# Patient Record
Sex: Male | Born: 1950 | Race: White | Hispanic: No | Marital: Married | Smoking: Never smoker
Health system: Southern US, Community
[De-identification: ages and names within clinical notes are randomized; demographics above are authoritative.]

## PROBLEM LIST (undated history)

## (undated) DIAGNOSIS — I700 Atherosclerosis of aorta: Secondary | ICD-10-CM

## (undated) DIAGNOSIS — R0609 Other forms of dyspnea: Secondary | ICD-10-CM

## (undated) DIAGNOSIS — Z7902 Long term (current) use of antithrombotics/antiplatelets: Secondary | ICD-10-CM

## (undated) DIAGNOSIS — H9190 Unspecified hearing loss, unspecified ear: Secondary | ICD-10-CM

## (undated) DIAGNOSIS — N400 Enlarged prostate without lower urinary tract symptoms: Secondary | ICD-10-CM

## (undated) DIAGNOSIS — I779 Disorder of arteries and arterioles, unspecified: Secondary | ICD-10-CM

## (undated) DIAGNOSIS — G4730 Sleep apnea, unspecified: Secondary | ICD-10-CM

## (undated) DIAGNOSIS — E785 Hyperlipidemia, unspecified: Secondary | ICD-10-CM

## (undated) DIAGNOSIS — R112 Nausea with vomiting, unspecified: Secondary | ICD-10-CM

## (undated) DIAGNOSIS — I10 Essential (primary) hypertension: Secondary | ICD-10-CM

## (undated) DIAGNOSIS — Z9841 Cataract extraction status, right eye: Secondary | ICD-10-CM

## (undated) DIAGNOSIS — G709 Myoneural disorder, unspecified: Secondary | ICD-10-CM

## (undated) DIAGNOSIS — R7303 Prediabetes: Secondary | ICD-10-CM

## (undated) DIAGNOSIS — F32A Depression, unspecified: Secondary | ICD-10-CM

## (undated) DIAGNOSIS — F329 Major depressive disorder, single episode, unspecified: Secondary | ICD-10-CM

## (undated) DIAGNOSIS — M179 Osteoarthritis of knee, unspecified: Secondary | ICD-10-CM

## (undated) DIAGNOSIS — M1990 Unspecified osteoarthritis, unspecified site: Secondary | ICD-10-CM

## (undated) DIAGNOSIS — I209 Angina pectoris, unspecified: Secondary | ICD-10-CM

## (undated) DIAGNOSIS — E119 Type 2 diabetes mellitus without complications: Secondary | ICD-10-CM

## (undated) DIAGNOSIS — Z9889 Other specified postprocedural states: Secondary | ICD-10-CM

## (undated) DIAGNOSIS — M25561 Pain in right knee: Secondary | ICD-10-CM

## (undated) DIAGNOSIS — T4145XA Adverse effect of unspecified anesthetic, initial encounter: Secondary | ICD-10-CM

## (undated) DIAGNOSIS — T8859XA Other complications of anesthesia, initial encounter: Secondary | ICD-10-CM

## (undated) DIAGNOSIS — Z9842 Cataract extraction status, left eye: Secondary | ICD-10-CM

## (undated) DIAGNOSIS — R011 Cardiac murmur, unspecified: Secondary | ICD-10-CM

## (undated) DIAGNOSIS — G4700 Insomnia, unspecified: Secondary | ICD-10-CM

## (undated) DIAGNOSIS — H9191 Unspecified hearing loss, right ear: Secondary | ICD-10-CM

## (undated) DIAGNOSIS — K5790 Diverticulosis of intestine, part unspecified, without perforation or abscess without bleeding: Secondary | ICD-10-CM

## (undated) HISTORY — PX: OTHER SURGICAL HISTORY: SHX169

## (undated) HISTORY — PX: AMPUTATION FINGER / THUMB: SUR24

## (undated) HISTORY — PX: PHOTOREFRACTIVE KERATOTOMY: SHX216

## (undated) HISTORY — PX: CORONARY ANGIOPLASTY: SHX604

---

## 1998-09-24 HISTORY — PX: HERNIA REPAIR: SHX51

## 2001-09-24 HISTORY — PX: KNEE ARTHROSCOPY: SUR90

## 2011-09-05 ENCOUNTER — Ambulatory Visit: Payer: Self-pay | Admitting: Gastroenterology

## 2011-09-05 DIAGNOSIS — K5790 Diverticulosis of intestine, part unspecified, without perforation or abscess without bleeding: Secondary | ICD-10-CM

## 2011-09-05 HISTORY — DX: Diverticulosis of intestine, part unspecified, without perforation or abscess without bleeding: K57.90

## 2013-02-16 ENCOUNTER — Emergency Department: Payer: Self-pay | Admitting: Emergency Medicine

## 2013-02-16 DIAGNOSIS — T300 Burn of unspecified body region, unspecified degree: Secondary | ICD-10-CM | POA: Insufficient documentation

## 2013-02-16 LAB — CBC WITH DIFFERENTIAL/PLATELET
Basophil #: 0.1 10*3/uL (ref 0.0–0.1)
Basophil %: 1.2 %
Eosinophil #: 0.1 10*3/uL (ref 0.0–0.7)
Eosinophil %: 1.2 %
HCT: 46.9 % (ref 40.0–52.0)
HGB: 16.2 g/dL (ref 13.0–18.0)
Monocyte #: 0.4 x10 3/mm (ref 0.2–1.0)
Monocyte %: 6.5 %
Neutrophil #: 5.4 10*3/uL (ref 1.4–6.5)
Neutrophil %: 79.4 %
WBC: 6.8 10*3/uL (ref 3.8–10.6)

## 2013-02-16 LAB — COMPREHENSIVE METABOLIC PANEL
Alkaline Phosphatase: 69 U/L (ref 50–136)
Anion Gap: 7 (ref 7–16)
BUN: 24 mg/dL — ABNORMAL HIGH (ref 7–18)
Calcium, Total: 9.6 mg/dL (ref 8.5–10.1)
Chloride: 108 mmol/L — ABNORMAL HIGH (ref 98–107)
Co2: 29 mmol/L (ref 21–32)
Creatinine: 1.29 mg/dL (ref 0.60–1.30)
EGFR (African American): >60
EGFR (Non-African Amer.): 59 — ABNORMAL LOW
Glucose: 103 mg/dL — ABNORMAL HIGH (ref 65–99)
SGPT (ALT): 30 U/L (ref 12–78)
Sodium: 144 mmol/L (ref 136–145)

## 2013-03-04 DIAGNOSIS — T22299A Burn of second degree of multiple sites of unspecified shoulder and upper limb, except wrist and hand, initial encounter: Secondary | ICD-10-CM | POA: Insufficient documentation

## 2013-03-04 DIAGNOSIS — T23299A Burn of second degree of multiple sites of unspecified wrist and hand, initial encounter: Secondary | ICD-10-CM | POA: Insufficient documentation

## 2014-03-29 DIAGNOSIS — M7630 Iliotibial band syndrome, unspecified leg: Secondary | ICD-10-CM | POA: Insufficient documentation

## 2014-06-07 ENCOUNTER — Ambulatory Visit: Payer: Self-pay | Admitting: Orthopedic Surgery

## 2015-02-03 DIAGNOSIS — M1711 Unilateral primary osteoarthritis, right knee: Secondary | ICD-10-CM | POA: Insufficient documentation

## 2015-02-03 DIAGNOSIS — S83231A Complex tear of medial meniscus, current injury, right knee, initial encounter: Secondary | ICD-10-CM | POA: Insufficient documentation

## 2015-02-23 ENCOUNTER — Ambulatory Visit: Payer: 59 | Admitting: Anesthesiology

## 2015-02-23 ENCOUNTER — Ambulatory Visit
Admission: RE | Admit: 2015-02-23 | Discharge: 2015-02-23 | Disposition: A | Discharge status: Home / Self Care | Payer: 59 | Source: Ambulatory Visit | Attending: Surgery | Admitting: Surgery

## 2015-02-23 ENCOUNTER — Encounter
Admission: RE | Disposition: A | Discharge status: Home / Self Care | Payer: Self-pay | Source: Ambulatory Visit | Attending: Surgery

## 2015-02-23 DIAGNOSIS — I10 Essential (primary) hypertension: Secondary | ICD-10-CM | POA: Diagnosis not present

## 2015-02-23 DIAGNOSIS — M1711 Unilateral primary osteoarthritis, right knee: Secondary | ICD-10-CM | POA: Insufficient documentation

## 2015-02-23 DIAGNOSIS — G4700 Insomnia, unspecified: Secondary | ICD-10-CM | POA: Diagnosis not present

## 2015-02-23 DIAGNOSIS — K5790 Diverticulosis of intestine, part unspecified, without perforation or abscess without bleeding: Secondary | ICD-10-CM | POA: Insufficient documentation

## 2015-02-23 DIAGNOSIS — F329 Major depressive disorder, single episode, unspecified: Secondary | ICD-10-CM | POA: Insufficient documentation

## 2015-02-23 DIAGNOSIS — X58XXXA Exposure to other specified factors, initial encounter: Secondary | ICD-10-CM | POA: Insufficient documentation

## 2015-02-23 DIAGNOSIS — Z79899 Other long term (current) drug therapy: Secondary | ICD-10-CM | POA: Diagnosis not present

## 2015-02-23 DIAGNOSIS — S83231A Complex tear of medial meniscus, current injury, right knee, initial encounter: Secondary | ICD-10-CM | POA: Insufficient documentation

## 2015-02-23 DIAGNOSIS — G4730 Sleep apnea, unspecified: Secondary | ICD-10-CM | POA: Insufficient documentation

## 2015-02-23 DIAGNOSIS — S83241A Other tear of medial meniscus, current injury, right knee, initial encounter: Secondary | ICD-10-CM | POA: Diagnosis present

## 2015-02-23 HISTORY — DX: Insomnia, unspecified: G47.00

## 2015-02-23 HISTORY — DX: Unspecified hearing loss, unspecified ear: H91.90

## 2015-02-23 HISTORY — DX: Other specified postprocedural states: Z98.890

## 2015-02-23 HISTORY — DX: Pain in right knee: M25.561

## 2015-02-23 HISTORY — DX: Other complications of anesthesia, initial encounter: T88.59XA

## 2015-02-23 HISTORY — DX: Adverse effect of unspecified anesthetic, initial encounter: T41.45XA

## 2015-02-23 HISTORY — DX: Diverticulosis of intestine, part unspecified, without perforation or abscess without bleeding: K57.90

## 2015-02-23 HISTORY — PX: KNEE ARTHROSCOPY: SHX127

## 2015-02-23 HISTORY — DX: Unspecified osteoarthritis, unspecified site: M19.90

## 2015-02-23 HISTORY — DX: Sleep apnea, unspecified: G47.30

## 2015-02-23 HISTORY — DX: Depression, unspecified: F32.A

## 2015-02-23 HISTORY — DX: Nausea with vomiting, unspecified: R11.2

## 2015-02-23 HISTORY — DX: Major depressive disorder, single episode, unspecified: F32.9

## 2015-02-23 SURGERY — ARTHROSCOPY, KNEE
Anesthesia: General | Laterality: Right | Wound class: Clean

## 2015-02-23 MED ORDER — BUPIVACAINE-EPINEPHRINE (PF) 0.5% -1:200000 IJ SOLN
INTRAMUSCULAR | Status: DC | PRN
  Administered 2015-02-23: 30 mL

## 2015-02-23 MED ORDER — METOCLOPRAMIDE HCL 5 MG PO TABS: 5.0000 mg | ORAL_TABLET | Freq: Three times a day (TID) | ORAL | Status: DC | PRN

## 2015-02-23 MED ORDER — ONDANSETRON HCL 4 MG/2ML IJ SOLN
INTRAMUSCULAR | Status: DC | PRN
  Administered 2015-02-23: 4 mg via INTRAVENOUS

## 2015-02-23 MED ORDER — ONDANSETRON HCL 4 MG/2ML IJ SOLN: 4.0000 mg | Freq: Four times a day (QID) | INTRAMUSCULAR | Status: DC | PRN

## 2015-02-23 MED ORDER — LIDOCAINE HCL (PF) 1 % IJ SOLN
INTRAMUSCULAR | Status: DC | PRN
  Administered 2015-02-23: 30 mL

## 2015-02-23 MED ORDER — BUPIVACAINE-EPINEPHRINE (PF) 0.5% -1:200000 IJ SOLN
INTRAMUSCULAR | Status: DC | PRN
  Administered 2015-02-23: 10 mL

## 2015-02-23 MED ORDER — POTASSIUM CHLORIDE IN NACL 20-0.9 MEQ/L-% IV SOLN: INTRAVENOUS | Status: DC

## 2015-02-23 MED ORDER — METOCLOPRAMIDE HCL 5 MG/ML IJ SOLN: 5.0000 mg | Freq: Three times a day (TID) | INTRAMUSCULAR | Status: DC | PRN

## 2015-02-23 MED ORDER — ONDANSETRON HCL 4 MG PO TABS: 4.0000 mg | ORAL_TABLET | Freq: Four times a day (QID) | ORAL | Status: DC | PRN

## 2015-02-23 MED ORDER — OXYCODONE HCL 5 MG PO TABS: 5.0000 mg | ORAL_TABLET | ORAL | Status: DC | PRN

## 2015-02-23 MED ORDER — ONDANSETRON HCL 4 MG/2ML IJ SOLN: 4.0000 mg | Freq: Once | INTRAMUSCULAR | Status: DC | PRN

## 2015-02-23 MED ORDER — MIDAZOLAM HCL 5 MG/5ML IJ SOLN
INTRAMUSCULAR | Status: DC | PRN
  Administered 2015-02-23: 2 mg via INTRAVENOUS

## 2015-02-23 MED ORDER — FENTANYL CITRATE (PF) 100 MCG/2ML IJ SOLN
INTRAMUSCULAR | Status: DC | PRN
  Administered 2015-02-23 (×2): 50 ug via INTRAVENOUS

## 2015-02-23 MED ORDER — LACTATED RINGERS IV SOLN
INTRAVENOUS | Status: DC
  Administered 2015-02-23: 12:00:00 via INTRAVENOUS

## 2015-02-23 MED ORDER — HYDROCODONE-ACETAMINOPHEN 7.5-325 MG PO TABS: 1.0000 | ORAL_TABLET | Freq: Once | ORAL | Status: DC | PRN

## 2015-02-23 MED ORDER — LIDOCAINE HCL (CARDIAC) 20 MG/ML IV SOLN
INTRAVENOUS | Status: DC | PRN
  Administered 2015-02-23: 40 mg via INTRATRACHEAL

## 2015-02-23 MED ORDER — LACTATED RINGERS IR SOLN
Status: DC | PRN
  Administered 2015-02-23: 600 mL

## 2015-02-23 MED ORDER — DEXAMETHASONE SODIUM PHOSPHATE 4 MG/ML IJ SOLN
INTRAMUSCULAR | Status: DC | PRN
  Administered 2015-02-23: 8 mg via INTRAVENOUS

## 2015-02-23 MED ORDER — SCOPOLAMINE 1 MG/3DAYS TD PT72
1.0000 | MEDICATED_PATCH | Freq: Once | TRANSDERMAL | Status: DC
  Administered 2015-02-23: 1.5 mg via TRANSDERMAL

## 2015-02-23 MED ORDER — PROPOFOL 10 MG/ML IV BOLUS
INTRAVENOUS | Status: DC | PRN
Start: 2015-02-23 — End: 2015-02-23
  Administered 2015-02-23: 200 mg via INTRAVENOUS

## 2015-02-23 MED ORDER — GLYCOPYRROLATE 0.2 MG/ML IJ SOLN
INTRAMUSCULAR | Status: DC | PRN
  Administered 2015-02-23: 0.2 mg via INTRAVENOUS

## 2015-02-23 MED ORDER — CEFAZOLIN SODIUM-DEXTROSE 2-3 GM-% IV SOLR
2.0000 g | Freq: Once | INTRAVENOUS | Status: AC
  Administered 2015-02-23: 2 g via INTRAVENOUS

## 2015-02-23 MED ORDER — FENTANYL CITRATE (PF) 100 MCG/2ML IJ SOLN: 25.0000 ug | INTRAMUSCULAR | Status: DC | PRN

## 2015-02-23 SURGICAL SUPPLY — 45 items
ADAPTER IRRIG TUBE 2 SPIKE SOL (ADAPTER) IMPLANT
BANDAGE ELASTIC 2 VELCRO NS LF (GAUZE/BANDAGES/DRESSINGS) IMPLANT
BANDAGE ELASTIC 4 VELCRO NS (GAUZE/BANDAGES/DRESSINGS) IMPLANT
BANDAGE ELASTIC 6 VELCRO NS (GAUZE/BANDAGES/DRESSINGS) ×3 IMPLANT
BLADE FULL RADIUS 3.5 (BLADE) ×3 IMPLANT
BLADE SHAVER 4.5X7 STR FR (MISCELLANEOUS) IMPLANT
BUR ACROMIONIZER 4.0 (BURR) IMPLANT
BUR BR 5.5 WIDE MOUTH (BURR) IMPLANT
CANNULA SHAVER 8MMX76MM (CANNULA) IMPLANT
CHLORAPREP W/TINT 26ML (MISCELLANEOUS) ×3 IMPLANT
COVER LIGHT HANDLE UNIVERSAL (MISCELLANEOUS) ×3 IMPLANT
CUFF TOURN SGL QUICK 24 (TOURNIQUET CUFF)
CUFF TOURN SGL QUICK 30 (MISCELLANEOUS) ×2
CUFF TOURN SGL QUICK 34 (TOURNIQUET CUFF)
CUFF TRNQT CYL 24X4X40X1 (TOURNIQUET CUFF) IMPLANT
CUFF TRNQT CYL 34X4X40X1 (TOURNIQUET CUFF) IMPLANT
CUFF TRNQT CYL LO 30X4X (MISCELLANEOUS) ×1 IMPLANT
GAUZE PETRO XEROFOAM 1X8 (MISCELLANEOUS) IMPLANT
GAUZE SPONGE 4X4 12PLY STRL (GAUZE/BANDAGES/DRESSINGS) ×3 IMPLANT
GLOVE BIO SURGEON STRL SZ8 (GLOVE) ×6 IMPLANT
GLOVE INDICATOR 8.0 STRL GRN (GLOVE) ×3 IMPLANT
GOWN STRL REUS W/ TWL LRG LVL3 (GOWN DISPOSABLE) ×1 IMPLANT
GOWN STRL REUS W/ TWL XL LVL3 (GOWN DISPOSABLE) ×1 IMPLANT
GOWN STRL REUS W/TWL LRG LVL3 (GOWN DISPOSABLE) ×2
GOWN STRL REUS W/TWL XL LVL3 (GOWN DISPOSABLE) ×2
IV LACTATED RINGER IRRG 3000ML (IV SOLUTION) ×4
IV LR IRRIG 3000ML ARTHROMATIC (IV SOLUTION) ×2 IMPLANT
KIT CANNULA 8X76-LX IN CANNULA (CANNULA) IMPLANT
MANIFOLD 4PT FOR NEPTUNE1 (MISCELLANEOUS) ×3 IMPLANT
MAT BLUE FLOOR 46X72 FLO (MISCELLANEOUS) ×3 IMPLANT
NDL MAYO CATGUT SZ5 (NEEDLE)
NDL SUT 5 .5 CRC TPR PNT MAYO (NEEDLE) IMPLANT
NEEDLE HYPO 21X1.5 SAFETY (NEEDLE) ×6 IMPLANT
PACK ARTHROSCOPY KNEE (MISCELLANEOUS) ×3 IMPLANT
PAD GROUND ADULT SPLIT (MISCELLANEOUS) IMPLANT
STAPLER SKIN PROX 35W (STAPLE) IMPLANT
STRAP BODY AND KNEE 60X3 (MISCELLANEOUS) ×3 IMPLANT
SUT ETHIBOND 0 MO6 C/R (SUTURE) IMPLANT
SUT PROLENE 4 0 PS 2 18 (SUTURE) ×3 IMPLANT
SUT VIC AB 2-0 CT1 27 (SUTURE)
SUT VIC AB 2-0 CT1 TAPERPNT 27 (SUTURE) IMPLANT
SYR 50ML LL SCALE MARK (SYRINGE) ×3 IMPLANT
TUBING ARTHRO INFLOW-ONLY STRL (TUBING) ×3 IMPLANT
WAND HAND CNTRL MULTIVAC 90 (MISCELLANEOUS) ×3 IMPLANT
WEIGHT CUFF 3# RED (MISCELLANEOUS) IMPLANT

## 2015-02-23 NOTE — Op Note (Signed)
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  Postoperative diagnosis:   Same  Procedure:   Arthroscopic partial medial meniscectomy and debridement right knee.  Surgeon:   Maryagnes AmosJ. Jeffrey Poggi, M.D.  Anesthesia:   General LMA.  Findings:   As above. The lateral meniscus was in satisfactory condition, as were the anterior and posterior cruciate ligaments. There were grade 1-2 chondromalacial changes involving the medial and lateral femoral condyles, the medial tibial plateau, and the patella.  Complications:   None.  EBL:   <5 cc.  Total fluids:   700 cc of crystalloid.  Tourniquet time:   None  Drains:   None  Closure:   4-0 Prolene interrupted sutures.  Brief clinical note:   The patient is a 64 year old male with a several month history of medial sided right knee pain. His symptoms have persisted despite medications, activity modification, etc. His history and examination are consistent with a degenerative medial meniscus tear confirmed by MRI scan. The patient presents at this time for arthroscopy, debridement, and partial lateral meniscectomy.  Procedure:   The patient was brought into the operating room and lain in the supine position. After adequate general laryngal mask anesthesia was obtained, a timeout was performed to verify the appropriate side. The patient's right knee was injected sterilely using a solution of 30 cc of 1% lidocaine and 30 cc of 0.5% Sensorcaine with epinephrine. The right lower extremity was prepped with DuraPrep solution before being draped sterilely. Preoperative antibiotics were administered. The expected portal sites were injected with 0.5% Sensorcaine without for the camera was placed in the anterolateral portal and instrumentation performed through the anteromedial portal. The knee was sequentially examined beginning in the suprapatellar pouch the progressing  to the patellofemoral space, the medial gutter compartment, the notch, and finally the lateral compartment entered. The findings were as described above. Abundant reactive synovial tissues anteriorly were debrided using the full-radius resector in order to improve visualization. The complex degenerative tear involving the posterior horn of the medial meniscus was debrided back to stable margins using the mini much was in full radius resector. Some probing of the remaining rim demonstrated excellent stability. Laterally, the meniscus was intact to probing. The ArthroCare wand was used to obtain hemostasis, controlling some bleeding and developed after debriding the abundant reactive synovial tissues anteriorly. The instruments were removed from the joint after suctioning the excess fluid. The portal sites were closed using 4-0 Prolene interrupted sutures before a sterile bulky dressing was applied to the knee. The patient was then awakened, extubated, and returned to the recovery room in satisfactory condition after tolerating the procedure well.

## 2015-02-23 NOTE — Anesthesia Preprocedure Evaluation (Signed)
Anesthesia Evaluation  Patient identified by MRN, date of birth, ID band  Reviewed: Allergy & Precautions, H&P , NPO status , Patient's Chart, lab work & pertinent test results  History of Anesthesia Complications (+) history of anesthetic complications  Airway Mallampati: II  TM Distance: >3 FB Neck ROM: full    Dental no notable dental hx.    Pulmonary    Pulmonary exam normal       Cardiovascular Rhythm:regular Rate:Normal     Neuro/Psych PSYCHIATRIC DISORDERS    GI/Hepatic   Endo/Other    Renal/GU      Musculoskeletal   Abdominal   Peds  Hematology   Anesthesia Other Findings   Reproductive/Obstetrics                             Anesthesia Physical Anesthesia Plan  ASA: II  Anesthesia Plan: General LMA   Post-op Pain Management:    Induction:   Airway Management Planned:   Additional Equipment:   Intra-op Plan:   Post-operative Plan:   Informed Consent: I have reviewed the patients History and Physical, chart, labs and discussed the procedure including the risks, benefits and alternatives for the proposed anesthesia with the patient or authorized representative who has indicated his/her understanding and acceptance.     Plan Discussed with: CRNA  Anesthesia Plan Comments:         Anesthesia Quick Evaluation

## 2015-02-23 NOTE — Anesthesia Procedure Notes (Signed)
Procedure Name: LMA Insertion Date/Time: 02/23/2015 12:08 PM Performed by: Andee PolesBUSH, Randy Delsol Pre-anesthesia Checklist: Patient identified, Emergency Drugs available, Suction available, Timeout performed and Patient being monitored Patient Re-evaluated:Patient Re-evaluated prior to inductionOxygen Delivery Method: Circle system utilized Preoxygenation: Pre-oxygenation with 100% oxygen Intubation Type: IV induction LMA: LMA inserted LMA Size: 4.0 Number of attempts: 1 Placement Confirmation: positive ETCO2 and breath sounds checked- equal and bilateral Tube secured with: Tape

## 2015-02-23 NOTE — H&P (Signed)
Paper H&P to be scanned into permanent record. H&P reviewed. No changes. 

## 2015-02-23 NOTE — Anesthesia Postprocedure Evaluation (Signed)
  Anesthesia Post-op Note  Patient: Randy Shaffer  Procedure(s) Performed: Procedure(s): ARTHROSCOPY KNEE WITH DEBRIDEMENT PARTIAL MEDIAL MENISECTOMY (Right)  Anesthesia type:General LMA  Patient location: PACU  Post pain: Pain level controlled  Post assessment: Post-op Vital signs reviewed, Patient's Cardiovascular Status Stable, Respiratory Function Stable, Patent Airway and No signs of Nausea or vomiting  Post vital signs: Reviewed and stable  Last Vitals:  Filed Vitals:   02/23/15 1338  BP:   Pulse:   Temp: 36.4 C  Resp: 12    Level of consciousness: awake, alert  and patient cooperative  Complications: No apparent anesthesia complications

## 2015-02-23 NOTE — Discharge Instructions (Signed)
General Anesthesia, Care After °Refer to this sheet in the next few weeks. These instructions provide you with information on caring for yourself after your procedure. Your health care provider may also give you more specific instructions. Your treatment has been planned according to current medical practices, but problems sometimes occur. Call your health care provider if you have any problems or questions after your procedure. °WHAT TO EXPECT AFTER THE PROCEDURE °After the procedure, it is typical to experience: °· Sleepiness. °· Nausea and vomiting. °HOME CARE INSTRUCTIONS °· For the first 24 hours after general anesthesia: °¨ Have a responsible person with you. °¨ Do not drive a car. If you are alone, do not take public transportation. °¨ Do not drink alcohol. °¨ Do not take medicine that has not been prescribed by your health care provider. °¨ Do not sign important papers or make important decisions. °¨ You may resume a normal diet and activities as directed by your health care provider. °· Change bandages (dressings) as directed. °· If you have questions or problems that seem related to general anesthesia, call the hospital and ask for the anesthetist or anesthesiologist on call. °SEEK MEDICAL CARE IF: °· You have nausea and vomiting that continue the day after anesthesia. °· You develop a rash. °SEEK IMMEDIATE MEDICAL CARE IF:  °· You have difficulty breathing. °· You have chest pain. °· You have any allergic problems. °Document Released: 12/17/2000 Document Revised: 09/15/2013 Document Reviewed: 03/26/2013 °[employer]® Patient Information ©2015 [employer]. This information is not intended to replace advice given to you by your health care provider. Make sure you discuss any questions you have with your health care provider. ° °Keep dressing dry and intact.  °May shower after dressing changed on post-op day #4 (Sunday).  °Cover staples/sutures with Band-Aids after drying off. °Apply ice frequently to  knee. °May weight-bear as tolerated - use crutches or walker as needed. °Follow-up in 10-14 days or as scheduled. °

## 2015-02-23 NOTE — Transfer of Care (Signed)
Immediate Anesthesia Transfer of Care Note  Patient: Randy Shaffer  Procedure(s) Performed: Procedure(s): ARTHROSCOPY KNEE WITH DEBRIDEMENT PARTIAL MEDIAL MENISECTOMY (Right)  Patient Location: PACU  Anesthesia Type: General LMA  Level of Consciousness: awake, alert  and patient cooperative  Airway and Oxygen Therapy: Patient Spontanous Breathing and Patient connected to supplemental oxygen  Post-op Assessment: Post-op Vital signs reviewed, Patient's Cardiovascular Status Stable, Respiratory Function Stable, Patent Airway and No signs of Nausea or vomiting  Post-op Vital Signs: Reviewed and stable  Complications: No apparent anesthesia complications

## 2015-02-24 ENCOUNTER — Encounter: Payer: Self-pay | Admitting: Surgery

## 2015-09-01 ENCOUNTER — Ambulatory Visit: Payer: Self-pay

## 2015-09-01 ENCOUNTER — Other Ambulatory Visit: Payer: Self-pay | Admitting: Occupational Medicine

## 2015-09-01 DIAGNOSIS — Z0000 Encounter for general adult medical examination without abnormal findings: Secondary | ICD-10-CM

## 2016-10-05 ENCOUNTER — Ambulatory Visit (INDEPENDENT_AMBULATORY_CARE_PROVIDER_SITE_OTHER): Payer: 59 | Admitting: Orthopaedic Surgery

## 2016-10-05 ENCOUNTER — Ambulatory Visit (INDEPENDENT_AMBULATORY_CARE_PROVIDER_SITE_OTHER): Payer: 59

## 2016-10-05 VITALS — BP 128/80 | HR 64

## 2016-10-05 DIAGNOSIS — M25561 Pain in right knee: Secondary | ICD-10-CM | POA: Diagnosis not present

## 2016-10-05 DIAGNOSIS — G8929 Other chronic pain: Secondary | ICD-10-CM | POA: Diagnosis not present

## 2016-10-05 NOTE — Progress Notes (Signed)
Office Visit Note   Patient: Randy Shaffer           Date of Birth: 04/12/1951           MRN: [id number] Visit Date: 10/05/2016              Requested by: Jerl MinaJames Hedrick, MD [address] [facility] [place], [place] [id number] PCP: Jerl MinaJames Hedrick, MD   Assessment & Plan: Visit Diagnoses:  1. Chronic pain of right knee           Mild medial joint narrowing post medial partial meniscectomy times 2.   Plan: Standing x-rays show minimal right knee  medial compartment narrowing. I discussed and not do not think he is going to be needing an knee replacement anytime soon. He can continue taking the Aleve intermittently take some Tylenol ice his knee at the end of the day continued to walk stay active and stay the in good shape as he has been doing. He has increased symptoms he could consider another cortisone injection which has not been done since before his second surgical procedure. We reviewed the x-rays and reviewed the arthroscopic photos he brought in discussed him in detail. We discussed activities are less likely to bother his knee such as walking on flat ground using a bike.  Follow-Up Instructions: No Follow-up on file.   Orders:  Orders Placed This Encounter  Procedures  . XR Knee 1-2 Views Right   No orders of the defined types were placed in this encounter.     Procedures: No procedures performed   Clinical Data: No additional findings.   Subjective: Chief Complaint  Patient presents with  . Right Knee - Pain    Randy Shaffer is here today complaining of right knee pain. Patient has had 2 scoped on his knee in the past 13 years. He states the first scope worked for many years. He states the scope he just recently had in 2016 only worked for about 2 months. He states when he sits for a bit he has a "ripping pain" in his knee. Painful after getting up from sitting. He states he is very active and goes up and down stairs most of the day at work.   Patient's active  walks 13 miles per day he brought with him arthroscopic pictures from both his procedures for review. This shows lateral compartment normal and some partial posterior medial meniscectomy with mild chondromalacia in the medial compartment which appears to be grade 2. He is very active in his job and walks 13 miles a day. Sometimes at the end of the dates aching more. Usually uses his other leg when he goes upstairs and hangs onto the rail.  Review of Systems  Constitutional: Negative for chills and diaphoresis.  HENT: Negative for ear discharge, ear pain and nosebleeds.   Eyes: Negative for discharge and visual disturbance.  Respiratory: Negative for cough, choking and shortness of breath.   Cardiovascular: Negative for chest pain and palpitations.  Gastrointestinal: Negative for abdominal distention and abdominal pain.  Endocrine: Negative for cold intolerance and heat intolerance.  Genitourinary: Negative for flank pain and hematuria.  Musculoskeletal:       Patient's had the knee arthroscopy 2 on the right. He is very active walks more than 3 miles a day. No rheumatologic conditions.  Skin: Negative for rash and wound.  Neurological: Negative for seizures and speech difficulty.  Hematological: Negative for adenopathy. Does not bruise/bleed easily.  Psychiatric/Behavioral: Negative for  agitation and suicidal ideas.     Objective: Vital Signs: There were no vitals taken for this visit.  Physical Exam  Constitutional: He is oriented to person, place, and time. He appears well-developed and well-nourished.  HENT:  Head: Normocephalic and atraumatic.  Eyes: EOM are normal. Pupils are equal, round, and reactive to light.  Neck: No tracheal deviation present. No thyromegaly present.  Cardiovascular: Normal rate.   Pulmonary/Chest: Effort normal. He has no wheezes.  Abdominal: Soft. Bowel sounds are normal.  Musculoskeletal:  Old healed right knee arthroscopic portals. Mild medial joint  line tenderness. Medial collateral takes normal stress testing anterior cruciate ligament PCL exam is normal. No knee effusion. Mild crepitus more than the right knee than left with knee range of motion. Ankle hip range of motion is normal distal pulses are intact.  Neurological: He is alert and oriented to person, place, and time.  Skin: Skin is warm and dry. Capillary refill takes less than 2 seconds.  Psychiatric: He has a normal mood and affect. His behavior is normal. Judgment and thought content normal.    Ortho Exam  Specialty Comments:  No specialty comments available.  Imaging: No results found.   PMFS History: There are no active problems to display for this patient.  Past Medical History:  Diagnosis Date  . Arthritis    OSTEO OF RIGHT KNEE  . Complication of anesthesia   . Depression   . Diverticulosis 09-05-11   COLONOSCOPY IN TRANSVERSE AND ASCENDING COLON  . 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 Insomnia   . Knee pain, right    COMPLEX TEAR OF MEDIAL MENISCUS  . PONV (postoperative nausea and vomiting)    SEVERE  . Sleep apnea     No family history on file.  Past Surgical History:  Procedure Laterality Date  . AMPUTATION FINGER / THUMB Right    THUMB PLUS 3 RECONSTRUCTIVE SURGERIES  . HEAD LACERATION REPAIR    . HERNIA REPAIR  2000   ONE AS INFANT  . HIP NERVE RELEASE    . KNEE ARTHROSCOPY Right 2003  . KNEE ARTHROSCOPY Right 02/23/2015   Procedure: ARTHROSCOPY KNEE WITH DEBRIDEMENT PARTIAL MEDIAL MENISECTOMY;  Surgeon: Christena Flake, MD;  Location: [facility];  Service: Orthopedics;  Laterality: Right;  . PHOTOREFRACTIVE KERATOTOMY     Social History   Occupational History  . Not on file.   Social History Main Topics  . Smoking status: Never Smoker  . Smokeless tobacco: Not on file  . Alcohol use Yes     Comment: 2-3 BEER/MONTH  . Drug use: No  . Sexual activity: Not on file

## 2016-12-06 ENCOUNTER — Ambulatory Visit: Payer: 59 | Attending: Internal Medicine

## 2016-12-06 DIAGNOSIS — R5381 Other malaise: Secondary | ICD-10-CM | POA: Diagnosis present

## 2016-12-06 DIAGNOSIS — F5101 Primary insomnia: Secondary | ICD-10-CM | POA: Insufficient documentation

## 2016-12-06 DIAGNOSIS — R0681 Apnea, not elsewhere classified: Secondary | ICD-10-CM | POA: Diagnosis present

## 2016-12-06 DIAGNOSIS — G4733 Obstructive sleep apnea (adult) (pediatric): Secondary | ICD-10-CM | POA: Insufficient documentation

## 2017-01-22 ENCOUNTER — Ambulatory Visit: Payer: 59 | Attending: Neurology

## 2017-01-22 DIAGNOSIS — G4733 Obstructive sleep apnea (adult) (pediatric): Secondary | ICD-10-CM | POA: Insufficient documentation

## 2017-01-22 DIAGNOSIS — F5101 Primary insomnia: Secondary | ICD-10-CM | POA: Insufficient documentation

## 2018-12-09 DIAGNOSIS — H903 Sensorineural hearing loss, bilateral: Secondary | ICD-10-CM | POA: Insufficient documentation

## 2021-12-13 ENCOUNTER — Other Ambulatory Visit: Payer: Self-pay

## 2021-12-13 ENCOUNTER — Encounter: Payer: Self-pay | Admitting: Internal Medicine

## 2021-12-13 ENCOUNTER — Ambulatory Visit
Admission: RE | Admit: 2021-12-13 | Discharge: 2021-12-13 | Disposition: A | Discharge status: Home / Self Care | Payer: Medicare Other | Source: Ambulatory Visit | Attending: Internal Medicine | Admitting: Internal Medicine

## 2021-12-13 ENCOUNTER — Encounter
Admission: RE | Disposition: A | Discharge status: Home / Self Care | Payer: Self-pay | Source: Ambulatory Visit | Attending: Internal Medicine

## 2021-12-13 DIAGNOSIS — I25110 Atherosclerotic heart disease of native coronary artery with unstable angina pectoris: Secondary | ICD-10-CM | POA: Insufficient documentation

## 2021-12-13 DIAGNOSIS — R0609 Other forms of dyspnea: Secondary | ICD-10-CM | POA: Diagnosis not present

## 2021-12-13 DIAGNOSIS — I2510 Atherosclerotic heart disease of native coronary artery without angina pectoris: Secondary | ICD-10-CM

## 2021-12-13 DIAGNOSIS — I200 Unstable angina: Secondary | ICD-10-CM

## 2021-12-13 HISTORY — DX: Atherosclerotic heart disease of native coronary artery without angina pectoris: I25.10

## 2021-12-13 HISTORY — PX: RIGHT/LEFT HEART CATH AND CORONARY ANGIOGRAPHY: CATH118266

## 2021-12-13 LAB — CARDIAC CATHETERIZATION: Cath EF Quantitative: 60 %

## 2021-12-13 SURGERY — RIGHT/LEFT HEART CATH AND CORONARY ANGIOGRAPHY
Anesthesia: Moderate Sedation

## 2021-12-13 MED ORDER — LABETALOL HCL 5 MG/ML IV SOLN: 10.0000 mg | INTRAVENOUS | Status: DC | PRN

## 2021-12-13 MED ORDER — LIDOCAINE HCL 1 % IJ SOLN
INTRAMUSCULAR | Status: AC
  Filled 2021-12-13: qty 20

## 2021-12-13 MED ORDER — FENTANYL CITRATE (PF) 100 MCG/2ML IJ SOLN
INTRAMUSCULAR | Status: AC
  Filled 2021-12-13: qty 2

## 2021-12-13 MED ORDER — HEPARIN (PORCINE) IN NACL 2000-0.9 UNIT/L-% IV SOLN
INTRAVENOUS | Status: DC | PRN
  Administered 2021-12-13: 1000 mL

## 2021-12-13 MED ORDER — ASPIRIN 81 MG PO CHEW: 81.0000 mg | CHEWABLE_TABLET | ORAL | Status: DC

## 2021-12-13 MED ORDER — MIDAZOLAM HCL 2 MG/2ML IJ SOLN
INTRAMUSCULAR | Status: DC | PRN
  Administered 2021-12-13: 1 mg via INTRAVENOUS

## 2021-12-13 MED ORDER — HEPARIN (PORCINE) IN NACL 1000-0.9 UT/500ML-% IV SOLN
INTRAVENOUS | Status: AC
  Filled 2021-12-13: qty 1000

## 2021-12-13 MED ORDER — SODIUM CHLORIDE 0.9% FLUSH: 3.0000 mL | INTRAVENOUS | Status: DC | PRN

## 2021-12-13 MED ORDER — FENTANYL CITRATE (PF) 100 MCG/2ML IJ SOLN
INTRAMUSCULAR | Status: DC | PRN
  Administered 2021-12-13: 50 ug via INTRAVENOUS

## 2021-12-13 MED ORDER — IOHEXOL 300 MG/ML  SOLN
INTRAMUSCULAR | Status: DC | PRN
  Administered 2021-12-13: 58 mL

## 2021-12-13 MED ORDER — LIDOCAINE HCL (PF) 1 % IJ SOLN
INTRAMUSCULAR | Status: DC | PRN
  Administered 2021-12-13: 20 mL

## 2021-12-13 MED ORDER — SODIUM CHLORIDE 0.9 % IV SOLN: 250.0000 mL | INTRAVENOUS | Status: DC | PRN

## 2021-12-13 MED ORDER — ACETAMINOPHEN 325 MG PO TABS: 650.0000 mg | ORAL_TABLET | ORAL | Status: DC | PRN

## 2021-12-13 MED ORDER — SODIUM CHLORIDE 0.9% FLUSH: 3.0000 mL | Freq: Two times a day (BID) | INTRAVENOUS | Status: DC

## 2021-12-13 MED ORDER — ONDANSETRON HCL 4 MG/2ML IJ SOLN: 4.0000 mg | Freq: Four times a day (QID) | INTRAMUSCULAR | Status: DC | PRN

## 2021-12-13 MED ORDER — SODIUM CHLORIDE 0.9 % WEIGHT BASED INFUSION
3.0000 mL/kg/h | INTRAVENOUS | Status: AC
  Administered 2021-12-13: 3 mL/kg/h via INTRAVENOUS

## 2021-12-13 MED ORDER — SODIUM CHLORIDE 0.9 % WEIGHT BASED INFUSION: 1.0000 mL/kg/h | INTRAVENOUS | Status: DC

## 2021-12-13 MED ORDER — SODIUM CHLORIDE 0.9 % WEIGHT BASED INFUSION
1.0000 mL/kg/h | INTRAVENOUS | Status: DC
  Administered 2021-12-13: 1 mL/kg/h via INTRAVENOUS

## 2021-12-13 MED ORDER — MIDAZOLAM HCL 2 MG/2ML IJ SOLN
INTRAMUSCULAR | Status: AC
Start: 2021-12-13 — End: ?
  Filled 2021-12-13: qty 2

## 2021-12-13 MED ORDER — HYDRALAZINE HCL 20 MG/ML IJ SOLN: 10.0000 mg | INTRAMUSCULAR | Status: DC | PRN

## 2021-12-13 SURGICAL SUPPLY — 13 items
CATH INFINITI 5FR MULTPACK ANG (CATHETERS) ×1 IMPLANT
CATH SWAN GANZ 7F STRAIGHT (CATHETERS) ×1 IMPLANT
DEVICE CLOSURE MYNXGRIP 5F (Vascular Products) ×1 IMPLANT
DRAPE BRACHIAL (DRAPES) ×2 IMPLANT
NDL PERC 18GX7CM (NEEDLE) IMPLANT
NEEDLE PERC 18GX7CM (NEEDLE) ×2 IMPLANT
PACK CARDIAC CATH (CUSTOM PROCEDURE TRAY) ×2 IMPLANT
PROTECTION STATION PRESSURIZED (MISCELLANEOUS) ×2
SET ATX SIMPLICITY (MISCELLANEOUS) ×1 IMPLANT
SHEATH AVANTI 5FR X 11CM (SHEATH) ×1 IMPLANT
SHEATH AVANTI 7FRX11 (SHEATH) ×1 IMPLANT
STATION PROTECTION PRESSURIZED (MISCELLANEOUS) IMPLANT
WIRE GUIDERIGHT .035X150 (WIRE) ×1 IMPLANT

## 2021-12-14 ENCOUNTER — Encounter: Payer: Self-pay | Admitting: Internal Medicine

## 2022-03-02 ENCOUNTER — Ambulatory Visit
Admission: RE | Admit: 2022-03-02 | Discharge: 2022-03-02 | Disposition: A | Discharge status: Home / Self Care | Payer: Medicare Other | Source: Ambulatory Visit | Attending: Internal Medicine | Admitting: Internal Medicine

## 2022-03-02 ENCOUNTER — Other Ambulatory Visit: Payer: Self-pay | Admitting: Internal Medicine

## 2022-03-02 DIAGNOSIS — Z136 Encounter for screening for cardiovascular disorders: Secondary | ICD-10-CM

## 2022-03-02 DIAGNOSIS — R55 Syncope and collapse: Secondary | ICD-10-CM | POA: Insufficient documentation

## 2022-03-02 LAB — POCT I-STAT CREATININE: Creatinine, Ser: 1.1 mg/dL (ref 0.61–1.24)

## 2022-03-02 MED ORDER — IOHEXOL 350 MG/ML SOLN
75.0000 mL | Freq: Once | INTRAVENOUS | Status: AC | PRN
  Administered 2022-03-02: 75 mL via INTRAVENOUS

## 2022-03-06 ENCOUNTER — Ambulatory Visit: Payer: Medicare Other | Admitting: Urology

## 2022-03-06 ENCOUNTER — Encounter: Payer: Self-pay | Admitting: Urology

## 2022-03-06 ENCOUNTER — Other Ambulatory Visit
Admission: RE | Admit: 2022-03-06 | Discharge: 2022-03-06 | Disposition: A | Discharge status: Home / Self Care | Payer: Medicare Other | Attending: Urology | Admitting: Urology

## 2022-03-06 VITALS — BP 130/85 | HR 70 | Ht 76.0 in | Wt 223.4 lb

## 2022-03-06 DIAGNOSIS — R972 Elevated prostate specific antigen [PSA]: Secondary | ICD-10-CM

## 2022-03-06 DIAGNOSIS — R3914 Feeling of incomplete bladder emptying: Secondary | ICD-10-CM

## 2022-03-06 DIAGNOSIS — N401 Enlarged prostate with lower urinary tract symptoms: Secondary | ICD-10-CM | POA: Diagnosis not present

## 2022-03-06 LAB — BLADDER SCAN AMB NON-IMAGING

## 2022-03-06 MED ORDER — TAMSULOSIN HCL 0.4 MG PO CAPS: 0.4000 mg | ORAL_CAPSULE | Freq: Every day | ORAL | 11 refills | Status: DC

## 2022-03-06 NOTE — Patient Instructions (Addendum)
Prostate Cancer Screening  Prostate cancer screening is testing that is done to check for the presence of prostate cancer in men. The prostate gland is a walnut-sized gland that is located below the bladder and in front of the rectum in males. The function of the prostate is to add fluid to semen during ejaculation. Prostate cancer is one of the most common types of cancer in men. Who should have prostate cancer screening? Screening recommendations vary based on age and other risk factors, as well as between the professional organizations who make the recommendations. In general, screening is recommended if: You are age 50 to 70 and have an average risk for prostate cancer. You should talk with your health care provider about your need for screening and how often screening should be done. Because most prostate cancers are slow growing and will not cause death, screening in this age group is generally reserved for men who have a 10- to 15-year life expectancy. You are younger than age 50, and you have these risk factors: Having a father, brother, or uncle who has been diagnosed with prostate cancer. The risk is higher if your family member's cancer occurred at an early age or if you have multiple family members with prostate cancer at an early age. Being a male who is Black or is of Caribbean or sub-Saharan African descent. In general, screening is not recommended if: You are younger than age 40. You are between the ages of 40 and 49 and you have no risk factors. You are 70 years of age or older. At this age, the risks that screening can cause are greater than the benefits that it may provide. If you are at high risk for prostate cancer, your health care provider may recommend that you have screenings more often or that you start screening at a younger age. How is screening for prostate cancer done? The recommended prostate cancer screening test is a blood test called the prostate-specific antigen  (PSA) test. PSA is a protein that is made in the prostate. As you age, your prostate naturally produces more PSA. Abnormally high PSA levels may be caused by: Prostate cancer. An enlarged prostate that is not caused by cancer (benign prostatic hyperplasia, or BPH). This condition is very common in older men. A prostate gland infection (prostatitis) or urinary tract infection. Certain medicines such as male hormones (like testosterone) or other medicines that raise testosterone levels. A rectal exam may be done as part of prostate cancer screening to help provide information about the size of your prostate gland. When a rectal exam is performed, it should be done after the PSA level is drawn to avoid any effect on the results. Depending on the PSA results, you may need more tests, such as: A physical exam to check the size of your prostate gland, if not done as part of screening. Blood and imaging tests. A procedure to remove tissue samples from your prostate gland for testing (biopsy). This is the only way to know for certain if you have prostate cancer. What are the benefits of prostate cancer screening? Screening can help to identify cancer at an early stage, before symptoms start and when the cancer can be treated more easily. There is a small chance that screening may lower your risk of dying from prostate cancer. The chance is small because prostate cancer is a slow-growing cancer, and most men with prostate cancer die from a different cause. What are the risks of prostate cancer screening? The   main risk of prostate cancer screening is diagnosing and treating prostate cancer that would never have caused any symptoms or problems. This is called overdiagnosisand overtreatment. PSA screening cannot tell you if your PSA is high due to cancer or a different cause. A prostate biopsy is the only procedure to diagnose prostate cancer. Even the results of a biopsy may not tell you if your cancer needs to  be treated. Slow-growing prostate cancer may not need any treatment other than monitoring, so diagnosing and treating it may cause unnecessary stress or other side effects. Questions to ask your health care provider When should I start prostate cancer screening? What is my risk for prostate cancer? How often do I need screening? What type of screening tests do I need? How do I get my test results? What do my results mean? Do I need treatment? Where to find more information The American Cancer Society: www.cancer.org American Urological Association: www.auanet.org Contact a health care provider if: You have difficulty urinating. You have pain when you urinate or ejaculate. You have blood in your urine or semen. You have pain in your back or in the area of your prostate. Summary Prostate cancer is a common type of cancer in men. The prostate gland is located below the bladder and in front of the rectum. This gland adds fluid to semen during ejaculation. Prostate cancer screening may identify cancer at an early stage, when the cancer can be treated more easily and is less likely to have spread to other areas of the body. The prostate-specific antigen (PSA) test is the recommended screening test for prostate cancer, but it has associated risks. Discuss the risks and benefits of prostate cancer screening with your health care provider. If you are age 70 or older, the risks that screening can cause are greater than the benefits that it may provide. This information is not intended to replace advice given to you by your health care provider. Make sure you discuss any questions you have with your health care provider. Document Revised: 03/06/2021 Document Reviewed: 03/06/2021 Elsevier Patient Education  2023 Elsevier Inc.  Benign Prostatic Hyperplasia  Benign prostatic hyperplasia (BPH) is an enlarged prostate gland that is caused by the normal aging process. The prostate may get bigger as a man  gets older. The condition is not caused by cancer. The prostate is a walnut-sized gland that is involved in the production of semen. It is located in front of the rectum and below the bladder. The bladder stores urine. The urethra carries stored urine out of the body. An enlarged prostate can press on the urethra. This can make it harder to pass urine. The buildup of urine in the bladder can cause infection. Back pressure and infection may progress to bladder damage and kidney (renal) failure. What are the causes? This condition is part of the normal aging process. However, not all men develop problems from this condition. If the prostate enlarges away from the urethra, urine flow will not be blocked. If it enlarges toward the urethra and compresses it, there will be problems passing urine. What increases the risk? This condition is more likely to develop in men older than 50 years. What are the signs or symptoms? Symptoms of this condition include: Getting up often during the night to urinate. Needing to urinate frequently during the day. Difficulty starting urine flow. Decrease in size and strength of your urine stream. Leaking (dribbling) after urinating. Inability to pass urine. This needs immediate treatment. Inability to completely   empty your bladder. Pain when you pass urine. This is more common if there is also an infection. Urinary tract infection (UTI). How is this diagnosed? This condition is diagnosed based on your medical history, a physical exam, and your symptoms. Tests will also be done, such as: A post-void bladder scan. This measures any amount of urine that may remain in your bladder after you finish urinating. A digital rectal exam. In a rectal exam, your health care provider checks your prostate by putting a lubricated, gloved finger into your rectum to feel the back of your prostate gland. This exam detects the size of your gland and any abnormal lumps or growths. An exam  of your urine (urinalysis). A prostate specific antigen (PSA) screening. This is a blood test used to screen for prostate cancer. An ultrasound. This test uses sound waves to electronically produce a picture of your prostate gland. Your health care provider may refer you to a specialist in kidney and prostate diseases (urologist). How is this treated? Once symptoms begin, your health care provider will monitor your condition (active surveillance or watchful waiting). Treatment for this condition will depend on the severity of your condition. Treatment may include: Observation and yearly exams. This may be the only treatment needed if your condition and symptoms are mild. Medicines to relieve your symptoms, including: Medicines to shrink the prostate. Medicines to relax the muscle of the prostate. Surgery in severe cases. Surgery may include: Prostatectomy. In this procedure, the prostate tissue is removed completely through an open incision or with a laparoscope or robotics. Transurethral resection of the prostate (TURP). In this procedure, a tool is inserted through the opening at the tip of the penis (urethra). It is used to cut away tissue of the inner core of the prostate. The pieces are removed through the same opening of the penis. This removes the blockage. Transurethral incision (TUIP). In this procedure, small cuts are made in the prostate. This lessens the prostate's pressure on the urethra. Transurethral microwave thermotherapy (TUMT). This procedure uses microwaves to create heat. The heat destroys and removes a small amount of prostate tissue. Transurethral needle ablation (TUNA). This procedure uses radio frequencies to destroy and remove a small amount of prostate tissue. Interstitial laser coagulation (ILC). This procedure uses a laser to destroy and remove a small amount of prostate tissue. Transurethral electrovaporization (TUVP). This procedure uses electrodes to destroy and  remove a small amount of prostate tissue. Prostatic urethral lift. This procedure inserts an implant to push the lobes of the prostate away from the urethra. Follow these instructions at home: Take over-the-counter and prescription medicines only as told by your health care provider. Monitor your symptoms for any changes. Contact your health care provider with any changes. Avoid drinking large amounts of liquid before going to bed or out in public. Avoid or reduce how much caffeine or alcohol you drink. Give yourself time when you urinate. Keep all follow-up visits. This is important. Contact a health care provider if: You have unexplained back pain. Your symptoms do not get better with treatment. You develop side effects from the medicine you are taking. Your urine becomes very dark or has a bad smell. Your lower abdomen becomes distended and you have trouble passing urine. Get help right away if: You have a fever or chills. You suddenly cannot urinate. You feel light-headed or very dizzy, or you faint. There are large amounts of blood or clots in your urine. Your urinary problems become hard to manage.   You develop moderate to severe low back or flank pain. The flank is the side of your body between the ribs and the hip. These symptoms may be an emergency. Get help right away. Call 911. Do not wait to see if the symptoms will go away. Do not drive yourself to the hospital. Summary Benign prostatic hyperplasia (BPH) is an enlarged prostate that is caused by the normal aging process. It is not caused by cancer. An enlarged prostate can press on the urethra. This can make it hard to pass urine. This condition is more likely to develop in men older than 50 years. Get help right away if you suddenly cannot urinate. This information is not intended to replace advice given to you by your health care provider. Make sure you discuss any questions you have with your health care  provider. Document Revised: 03/29/2021 Document Reviewed: 03/29/2021 Elsevier Patient Education  2023 Elsevier Inc.  

## 2022-03-06 NOTE — Progress Notes (Signed)
03/06/22 1:39 PM   Boneta Lucks [birth year] [id number]  CC: Elevated PSA, lower urinary tract symptoms  HPI: I saw Mr. Randy Shaffer today for evaluation of the above issues.  He has baseline PSA over the last few years have been around 1.8, and he had a slight increase to 2.66 in February 2023.  His PCP ordered a 54-month follow-up PSA that continued to rise at 4.48 and he was referred to urology.  DRE with PCP recently was benign.  Urinalysis at that visit was benign with no microscopic hematuria.  He also reports some increase in urinary symptoms over the last few months with some weak stream and some dribbling, especially in the evening.  He is currently sitting to void.  PVR today significantly elevated at 400 mL despite just voiding.  He denies a family history of prostate cancer.  Denies any gross hematuria or dysuria.  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   Insomnia    Knee pain, right    COMPLEX TEAR OF MEDIAL MENISCUS   PONV (postoperative nausea and vomiting)    SEVERE   Sleep apnea     Surgical History: Past Surgical History:  Procedure Laterality Date   AMPUTATION FINGER / THUMB Right    THUMB PLUS 3 RECONSTRUCTIVE SURGERIES   HEAD LACERATION REPAIR     HERNIA REPAIR  2000   ONE AS INFANT   HIP NERVE RELEASE     KNEE ARTHROSCOPY Right 2003   KNEE ARTHROSCOPY Right 02/23/2015   Procedure: ARTHROSCOPY KNEE WITH DEBRIDEMENT PARTIAL MEDIAL MENISECTOMY;  Surgeon: Christena Flake, MD;  Location: [facility];  Service: Orthopedics;  Laterality: Right;   PHOTOREFRACTIVE KERATOTOMY     RIGHT/LEFT HEART CATH AND CORONARY ANGIOGRAPHY N/A 12/13/2021   Procedure: RIGHT/LEFT HEART CATH AND CORONARY ANGIOGRAPHY;  Surgeon: Alwyn Pea, MD;  Location: [facility];   Service: Cardiovascular;  Laterality: N/A;    Social History:  reports that he has never smoked. He does not have any smokeless tobacco history on file. He reports current alcohol use. He reports that he does not use drugs.  Physical Exam: BP 130/85 (BP Location: Left Arm, Patient Position: Sitting, Cuff Size: Large)   Pulse 70   Ht 6\' 4"  (1.93 m)   Wt 223 lb 6.4 oz (101.3 kg)   BMI 27.19 kg/m    Constitutional:  Alert and oriented, No acute distress. Cardiovascular: No clubbing, cyanosis, or edema. Respiratory: Normal respiratory effort, no increased work of breathing. GI: Abdomen is soft, nontender, nondistended, no abdominal masses  Laboratory Data: Reviewed, see HPI  Assessment & Plan:   71 year old male with mild increase in PSA to 4.48 as well as some obstructive type urinary symptoms with weak stream and incomplete bladder emptying with PVR of 400 mL.  Suspect his mild elevation in PSA most likely secondary to BPH with incomplete bladder emptying, but prostate cancer also possible.  I recommended a repeat PSA today with reflex to free, as well as a trial of Flomax.  If PSA remains elevated we will schedule prostate biopsy and simultaneous cystoscopy to evaluate for outlet procedures, and if PSA back to normal, close follow-up in a few weeks for repeat PVR, consider cystoscopy/TRUS for further  evaluation of outlet procedures.  We reviewed the implications of an elevated PSA and the uncertainty surrounding it. In general, a man's PSA increases with age and is produced by both normal and cancerous prostate tissue. The differential diagnosis for elevated PSA includes BPH, prostate cancer, infection, recent intercourse/ejaculation, recent urethroscopic manipulation (foley placement/cystoscopy) or trauma, and prostatitis.   Management of an elevated PSA can include observation or prostate biopsy and we discussed this in detail. Our goal is to detect clinically significant prostate cancers,  and manage with either active surveillance, surgery, or radiation for localized disease. Risks of prostate biopsy include bleeding, infection (including life threatening sepsis), pain, and lower urinary symptoms. Hematuria, hematospermia, and blood in the stool are all common after biopsy and can persist up to 4 weeks.   -Repeat PSA today with reflex to free, if remains elevated we will schedule prostate biopsy -If PSA normalized, close follow-up for repeat PVR on Flomax, with low threshold to pursue cystoscopy/TRUS and consideration of outlet procedures  Legrand Rams, MD 03/06/2022  [facility] [address], [address] [place], [place] [address] [phone]

## 2022-03-07 ENCOUNTER — Ambulatory Visit: Payer: Self-pay | Admitting: Urology

## 2022-03-07 LAB — PSA, TOTAL AND FREE
PSA, Free Pct: 20 %
PSA, Free: 0.64 ng/mL
Prostate Specific Ag, Serum: 3.2 ng/mL (ref 0.0–4.0)

## 2022-03-14 ENCOUNTER — Telehealth (INDEPENDENT_AMBULATORY_CARE_PROVIDER_SITE_OTHER): Payer: Self-pay | Admitting: Vascular Surgery

## 2022-03-14 NOTE — Telephone Encounter (Signed)
I reached out to Dr. Glennis Brink office at [facility]. I was transferred to the referral contact (Dawn ? ) and had to LVM. I advised that the patient had come into the office and requested an appt but we had not received a referral. I assume that the referral is coming from Dr. Glennis Brink office due to the fact that he is the ordering provider on file for the pt's CTA that was performed on 6.9.23. I asked referral contact to please call ASAP to advise of if they are sending referral over. We will await a call back or referral to be sent over.

## 2022-03-14 NOTE — Telephone Encounter (Signed)
Patient came into office requesting an appt. Let patient know we were a referral based office and had to have referral sent over. Patient let me know he would have referring office send it over. Patient called in on yesterday wanting to know if we had received referral. I let patient know that we did npt receive it ye. Patient states "if he does not receive a phone call from [place] by today 03/14/2022 by 12 pm noon he is calling the board of [place] on this office and the referring office for sitting on the referral and not doing anything about his health" I let patient know that we would reach out to referring office this morning to see what we can do to get the ball running for him patient understood.

## 2022-03-20 ENCOUNTER — Ambulatory Visit (INDEPENDENT_AMBULATORY_CARE_PROVIDER_SITE_OTHER): Payer: Medicare Other | Admitting: Vascular Surgery

## 2022-03-20 ENCOUNTER — Encounter (INDEPENDENT_AMBULATORY_CARE_PROVIDER_SITE_OTHER): Payer: Self-pay | Admitting: Vascular Surgery

## 2022-03-20 VITALS — BP 123/71 | HR 59 | Resp 16 | Wt 228.0 lb

## 2022-03-20 DIAGNOSIS — G4700 Insomnia, unspecified: Secondary | ICD-10-CM | POA: Insufficient documentation

## 2022-03-20 DIAGNOSIS — E785 Hyperlipidemia, unspecified: Secondary | ICD-10-CM | POA: Diagnosis not present

## 2022-03-20 DIAGNOSIS — I6522 Occlusion and stenosis of left carotid artery: Secondary | ICD-10-CM

## 2022-03-20 DIAGNOSIS — G4730 Sleep apnea, unspecified: Secondary | ICD-10-CM | POA: Insufficient documentation

## 2022-03-20 DIAGNOSIS — I6529 Occlusion and stenosis of unspecified carotid artery: Secondary | ICD-10-CM | POA: Insufficient documentation

## 2022-03-20 DIAGNOSIS — I10 Essential (primary) hypertension: Secondary | ICD-10-CM | POA: Diagnosis not present

## 2022-03-20 MED ORDER — CLOPIDOGREL BISULFATE 75 MG PO TABS: 75.0000 mg | ORAL_TABLET | Freq: Every day | ORAL | 6 refills | Status: DC

## 2022-03-20 NOTE — H&P (View-Only) (Signed)
Patient ID: Randy Shaffer, male   DOB: 02/19/1951, 71 y.o.   MRN: [id number]  Chief Complaint  Patient presents with   New Patient (Initial Visit)    Ref Sherryll Burger consult. left>right carotid atherosclerosis. bulky soft plaque in the left ICA bulb causes hemodynamically significant stenosis numerically estimated at 70%     HPI Randy Shaffer is a 71 y.o. male.  I am asked to see the patient by Dr. Sherryll Burger for evaluation of carotid stenosis.  The patient reports worsening spells of dizziness and presyncope or syncope over the past several months.  This has been an ongoing problem even prior to that, but was much less frequent.  Now, he is having multiple episodes over a week.  No focal arm or leg weakness or numbness.  No speech or swallowing difficulty.  The patient underwent a CT angiogram at an outside imaging center but he brings a disc in for my review.  This was officially read out as a 70% left ICA stenosis with no significant right carotid stenosis.  I think that is a bit of an underestimate but this clearly represents a significant left carotid stenosis.  He is on aspirin and a statin agent.   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   Insomnia    Knee pain, right    COMPLEX TEAR OF MEDIAL MENISCUS   PONV (postoperative nausea and vomiting)    SEVERE   Sleep apnea     Past Surgical History:  Procedure Laterality Date   AMPUTATION FINGER / THUMB Right    THUMB PLUS 3 RECONSTRUCTIVE SURGERIES   HEAD LACERATION REPAIR     HERNIA REPAIR  2000   ONE AS INFANT   HIP NERVE RELEASE     KNEE ARTHROSCOPY Right 2003   KNEE ARTHROSCOPY Right 02/23/2015   Procedure: ARTHROSCOPY KNEE WITH DEBRIDEMENT PARTIAL MEDIAL MENISECTOMY;  Surgeon: Christena Flake, MD;  Location: [facility];  Service:  Orthopedics;  Laterality: Right;   PHOTOREFRACTIVE KERATOTOMY     RIGHT/LEFT HEART CATH AND CORONARY ANGIOGRAPHY N/A 12/13/2021   Procedure: RIGHT/LEFT HEART CATH AND CORONARY ANGIOGRAPHY;  Surgeon: Alwyn Pea, MD;  Location: [facility];  Service: Cardiovascular;  Laterality: N/A;    Family History No bleeding disorders, clotting disorders, autoimmune diseases, or aneurysms   Social History   Tobacco Use   Smoking status: Never  Substance Use Topics   Alcohol use: Yes    Comment: 2-3 BEER/MONTH   Drug use: No    Allergies  Allergen Reactions   Sodium Pantothenate [Pantothenic Acid] Nausea And Vomiting    SEVERE VOMITING    Current Outpatient Medications  Medication Sig Dispense Refill   aspirin EC 81 MG tablet Take 81 mg by mouth daily. Swallow whole.     aspirin-acetaminophen-caffeine (EXCEDRIN MIGRAINE) 250-250-65 MG tablet Take 1 tablet by mouth 2 (two) times daily as needed for headache.     atorvastatin (LIPITOR) 40 MG tablet Take 20 mg by mouth in the morning.     cetirizine (ZYRTEC) 10 MG tablet Take 10 mg by mouth daily as needed (hives reactions).     FLUoxetine (PROZAC) 20 MG capsule Take 40 mg by mouth in the morning.  tamsulosin (FLOMAX) 0.4 MG CAPS capsule Take 1 capsule (0.4 mg total) by mouth daily. 30 capsule 11   traZODone (DESYREL) 100 MG tablet Take 75 mg by mouth at bedtime as needed for sleep.     isosorbide mononitrate (IMDUR) 30 MG 24 hr tablet Take 30 mg by mouth in the morning. (Patient not taking: Reported on 03/20/2022)     lisinopril (ZESTRIL) 2.5 MG tablet Take by mouth. (Patient not taking: Reported on 03/20/2022)     melatonin 5 MG TABS Take 5 mg by mouth at bedtime as needed (sleep). (Patient not taking: Reported on 03/20/2022)     metoprolol succinate (TOPROL-XL) 25 MG 24 hr tablet Take 25 mg by mouth in the morning. (Patient not taking: Reported on 03/20/2022)     No current facility-administered medications for this visit.       REVIEW OF SYSTEMS (Negative unless checked)  Constitutional: [] Weight loss  [] Fever  [] Chills Cardiac: [] Chest pain   [] Chest pressure   [] Palpitations   [] Shortness of breath when laying flat   [] Shortness of breath at rest   [] Shortness of breath with exertion. Vascular:  [] Pain in legs with walking   [] Pain in legs at rest   [] Pain in legs when laying flat   [] Claudication   [] Pain in feet when walking  [] Pain in feet at rest  [] Pain in feet when laying flat   [] History of DVT   [] Phlebitis   [] Swelling in legs   [] Varicose veins   [] Non-healing ulcers Pulmonary:   [] Uses home oxygen   [] Productive cough   [] Hemoptysis   [] Wheeze  [] COPD   [] Asthma Neurologic:  [x] Dizziness  [x] Blackouts   [] Seizures   [] History of stroke   [] History of TIA  [] Aphasia   [] Temporary blindness   [] Dysphagia   [] Weakness or numbness in arms   [] Weakness or numbness in legs Musculoskeletal:  [x] Arthritis   [] Joint swelling   [] Joint pain   [] Low back pain Hematologic:  [] Easy bruising  [] Easy bleeding   [] Hypercoagulable state   [] Anemic  [] Hepatitis Gastrointestinal:  [] Blood in stool   [] Vomiting blood  [] Gastroesophageal reflux/heartburn   [] Abdominal pain Genitourinary:  [] Chronic kidney disease   [] Difficult urination  [] Frequent urination  [] Burning with urination   [] Hematuria Skin:  [] Rashes   [] Ulcers   [] Wounds Psychological:  [] History of anxiety   []  History of major depression.    Physical Exam BP 123/71 (BP Location: Right Arm)   Pulse (!) 59   Resp 16   Wt 228 lb (103.4 kg)   BMI 27.75 kg/m  Gen:  WD/WN, NAD Head: [place]/AT, No temporalis wasting.  Ear/Nose/Throat: Hearing grossly intact, nares w/o erythema or drainage, oropharynx w/o Erythema/Exudate Eyes: Conjunctiva clear, sclera non-icteric  Neck: trachea midline.  Left carotid bruit present Pulmonary:  Good air movement, clear to auscultation bilaterally.  Cardiac: RRR, no JVD Vascular:  Vessel Right Left  Radial Palpable  Palpable               Musculoskeletal: M/S 5/5 throughout.  Extremities without ischemic changes.  No deformity or atrophy.  No significant lower extremity edema. Neurologic: Sensation grossly intact in extremities.  Symmetrical.  Speech is fluent. Motor exam as listed above. Psychiatric: Judgment intact, Mood & affect appropriate for pt's clinical situation. Dermatologic: No rashes or ulcers noted.  No cellulitis or open wounds. Lymph : No Cervical, Axillary, or Inguinal lymphadenopathy.   Radiology CT ANGIO NECK W OR WO CONTRAST  Result Date: 03/05/2022 CLINICAL DATA:  71 year old male with increasing dizziness, syncope. Evidence of left carotid stenosis by ultrasound. It EXAM: CT ANGIOGRAPHY NECK TECHNIQUE: Multidetector CT imaging of the neck was performed using the standard protocol during bolus administration of intravenous contrast. Multiplanar CT image reconstructions and MIPs were obtained to evaluate the vascular anatomy. Carotid stenosis measurements (when applicable) are obtained utilizing NASCET criteria, using the distal internal carotid diameter as the denominator. RADIATION DOSE REDUCTION: This exam was performed according to the departmental dose-optimization program which includes automated exposure control, adjustment of the mA and/or kV according to patient size and/or use of iterative reconstruction technique. CONTRAST:  75mL OMNIPAQUE IOHEXOL 350 MG/ML SOLN COMPARISON:  Carotid Doppler ultrasound 01/10/2022 FINDINGS: Skeleton: Mild for age cervical spine degeneration. No acute osseous abnormality identified. Visualized paranasal sinuses and mastoids are well aerated. Upper chest: Mild apical scarring and bronchial wall thickening but otherwise negative visible upper lungs. No superior mediastinal lymphadenopathy. Other neck: Glottis is closed. No neck mass or lymphadenopathy identified. Grossly negative visible lower brain parenchyma, orbits. Aortic arch: Calcified aortic  atherosclerosis. Three vessel arch configuration. Right carotid system: No brachiocephalic or right CCA origin plaque. At the right carotid bifurcation mild combined soft and calcified plaque does not result in stenosis. Additional soft plaque of the right ICA bulb without stenosis. Visible right ICA siphon is patent with calcified plaque partially visible at the junction of the cavernous and supraclinoid segments. Left carotid system: No left CCA origin plaque or stenosis. Soft and calcified plaque at the left carotid bifurcation is fairly mild in the left ICA origin is patent without stenosis. However, there is bulky mostly soft plaque in the left ICA bulb resulting in stenosis numerically estimated at 70 % with respect to the distal vessel (series 5, image 59). Distal to the bulb the left ICA is normally patent to the skull base and the visible left siphon is patent with only mild calcified plaque. Vertebral arteries: Proximal subclavian soft plaque appears ulcerated on series 8, image 123, but there is less than 50 % stenosis with respect to the distal vessel. Normal right vertebral artery origin on series 8, image 140. Right vertebral artery is patent to the vertebrobasilar junction with no significant plaque or stenosis. Diminutive right PICA origin is patent. Visible basilar artery is patent along with the right AICA origin. Mild plaque in the proximal left subclavian artery without stenosis. Normal left vertebral artery origin on series 8, image 137. Tortuous left V1 segment. Fairly codominant left vertebral artery is patent to the vertebrobasilar junction with only mild plaque and no significant stenosis. Normal left PICA origin. Review of the MIP images confirms the above findings IMPRESSION: 1. Left > Right carotid atherosclerosis. Bulky soft plaque in the Left ICA bulb causes hemodynamically significant stenosis numerically estimated at 70%. 2. No significant right Carotid or Vertebral artery stenosis.  Soft plaque at the Right Subclavian Artery origin is Ulcerated but without significant stenosis. 3. Aortic Atherosclerosis (ICD10-I70.0). Electronically Signed   By: Odessa FlemingH  Hall M.D.   On: 03/05/2022 08:48    Labs Recent Results (from the past 2160 hour(s))  I-STAT creatinine     Status: None   Collection Time: 03/02/22  1:12 PM  Result Value Ref Range   Creatinine, Ser 1.10 0.61 - 1.24 mg/dL  Bladder Scan (Post Void Residual) in office     Status: None   Collection Time: 03/06/22  2:03 PM  Result Value Ref Range   Scan Result >42607mL   PSA, total and free  Status: None   Collection Time: 03/06/22  2:26 PM  Result Value Ref Range   PSA, Free 0.64 N/A ng/mL    Comment: Roche ECLIA methodology.   PSA, Free Pct 20.0 %    Comment: (NOTE) The table below lists the probability of prostate cancer for men with non-suspicious DRE results and total PSA between 4 and 10 ng/mL, by patient age Damaris Schooner, JAMA 1998, [id number]).                  % Free PSA       50-64 yr        65-75 yr                  0.00-10.00%        56%             55%                 10.01-15.00%        24%             35%                 15.01-20.00%        17%             23%                 20.01-25.00%        10%             20%                      >25.00%         5%              9% Please note:  Catalona et al did not make specific              recommendations regarding the use of              percent free PSA for any other population              of men. Performed At: [facility] [address] [place], [place] [id number] Jolene Schimke MD BO:[id number]    Prostate Specific Ag, Serum 3.2 0.0 - 4.0 ng/mL    Comment: (NOTE) Roche ECLIA methodology. According to the American Urological Association, Serum PSA should decrease and remain at undetectable levels after radical prostatectomy. The AUA defines biochemical recurrence as an initial PSA value 0.2 ng/mL or greater followed by a  subsequent confirmatory PSA value 0.2 ng/mL or greater. Values obtained with different assay methods or kits cannot be used interchangeably. Results cannot be interpreted as absolute evidence of the presence or absence of malignant disease.     Assessment/Plan:  Hypertension blood pressure control important in reducing the progression of atherosclerotic disease. On appropriate oral medications.   Hyperlipidemia lipid control important in reducing the progression of atherosclerotic disease. Continue statin therapy   Carotid stenosis The patient underwent a CT angiogram at an outside imaging center but he brings a disc in for my review.  This was officially read out as a 70% left ICA stenosis with no significant right carotid stenosis.  I think that is a bit of an underestimate but this clearly represents a significant left carotid stenosis. I have discussed with the patient both carotid artery stenting as well as carotid endarterectomy.  I discussed the differences between the 2 procedures  and the risks and benefits of the 2 procedures.  His anatomy appears amenable for carotid stenting and that would be his preference.  We will add him on Plavix at this time and plan left carotid stent placement in the near future.  Risks and benefits are discussed.      Festus Barren 03/20/2022, 4:57 PM   This note was created with Dragon medical transcription system.  Any errors from dictation are unintentional.

## 2022-04-03 ENCOUNTER — Telehealth (INDEPENDENT_AMBULATORY_CARE_PROVIDER_SITE_OTHER): Payer: Self-pay

## 2022-04-03 NOTE — Telephone Encounter (Signed)
Patient called regarding getting scheduled for his procedure with Dr. Wyn Quaker. I will get the patient scheduled.

## 2022-04-10 ENCOUNTER — Encounter: Payer: Self-pay | Admitting: Urology

## 2022-04-10 ENCOUNTER — Ambulatory Visit: Payer: Medicare Other | Admitting: Urology

## 2022-04-10 VITALS — BP 99/61 | HR 68 | Ht 76.0 in | Wt 217.5 lb

## 2022-04-10 DIAGNOSIS — Z125 Encounter for screening for malignant neoplasm of prostate: Secondary | ICD-10-CM

## 2022-04-10 DIAGNOSIS — R339 Retention of urine, unspecified: Secondary | ICD-10-CM

## 2022-04-10 DIAGNOSIS — N401 Enlarged prostate with lower urinary tract symptoms: Secondary | ICD-10-CM

## 2022-04-10 LAB — BLADDER SCAN AMB NON-IMAGING

## 2022-04-10 NOTE — Patient Instructions (Signed)
Cystoscopy Cystoscopy is a procedure that is used to help diagnose and sometimes treat conditions that affect the lower urinary tract. The lower urinary tract includes the bladder and the urethra. The urethra is the tube that drains urine from the bladder. Cystoscopy is done using a thin, tube-shaped instrument with a light and camera at the end (cystoscope). The cystoscope may be hard or flexible, depending on the goal of the procedure. The cystoscope is inserted through the urethra, into the bladder. Cystoscopy may be recommended if you have: Urinary tract infections that keep coming back. Blood in the urine (hematuria). An inability to control when you urinate (urinary incontinence) or an overactive bladder. Unusual cells found in a urine sample. A blockage in the urethra, such as a urinary stone. Painful urination. An abnormality in the bladder found during an intravenous pyelogram (IVP) or CT scan. What are the risks? Generally, this is a safe procedure. However, problems may occur, including: Infection. Bleeding.  What happens during the procedure?  You will be given one or more of the following: A medicine to numb the area (local anesthetic). The area around the opening of your urethra will be cleaned. The cystoscope will be passed through your urethra into your bladder. Germ-free (sterile) fluid will flow through the cystoscope to fill your bladder. The fluid will stretch your bladder so that your health care provider can clearly examine your bladder walls. Your doctor will look at the urethra and bladder. The cystoscope will be removed The procedure may vary among health care providers  What can I expect after the procedure? After the procedure, it is common to have: Some soreness or pain in your urethra. Urinary symptoms. These include: Mild pain or burning when you urinate. Pain should stop within a few minutes after you urinate. This may last for up to a few days after the  procedure. A small amount of blood in your urine for several days. Feeling like you need to urinate but producing only a small amount of urine. Follow these instructions at home: General instructions Return to your normal activities as told by your health care provider.  Drink plenty of fluids after the procedure. Keep all follow-up visits as told by your health care provider. This is important. Contact a health care provider if you: Have pain that gets worse or does not get better with medicine, especially pain when you urinate lasting longer than 72 hours after the procedure. Have trouble urinating. Get help right away if you: Have blood clots in your urine. Have a fever or chills. Are unable to urinate. Summary Cystoscopy is a procedure that is used to help diagnose and sometimes treat conditions that affect the lower urinary tract. Cystoscopy is done using a thin, tube-shaped instrument with a light and camera at the end. After the procedure, it is common to have some soreness or pain in your urethra. It is normal to have blood in your urine after the procedure.  If you were prescribed an antibiotic medicine, take it as told by your health care provider.  This information is not intended to replace advice given to you by your health care provider. Make sure you discuss any questions you have with your health care provider. Document Revised: 09/02/2018 Document Reviewed: 09/02/2018 Elsevier Patient Education  2020 Elsevier Inc.  Holmium Laser Enucleation of the Prostate (HoLEP)  HoLEP is a treatment for men with benign prostatic hyperplasia (BPH). The laser surgery removed blockages of urine flow, and is done without any   incisions on the body.     What is HoLEP?  HoLEP is a type of laser surgery used to treat obstruction (blockage) of urine flow as a result of benign prostatic hyperplasia (BPH). In men with BPH, the prostate gland is not cancerous, but has become enlarged. An  enlarged prostate can result in a number of urinary tract symptoms such as weak urinary stream, difficulty in starting urination, inability to urinate, frequent urination, or getting up at night to urinate.  HoLEP was developed in the 1990's as a more effective and less expensive surgical option for BPH, compared to other surgical options such as laser vaporization(PVP/greenlight laser), transurethral resection of the prostate(TURP), and open simple prostatectomy.   What happens during a HoLEP?  HoLEP requires general anesthesia ("asleep" throughout the procedure).   An antibiotic is given to reduce the risk of infection  A surgical instrument called a resectoscope is inserted through the urethra (the tube that carries urine from the bladder). The resectoscope has a camera that allows the surgeon to view the internal structure of the prostate gland, and to see where the incisions are being made during surgery.  The laser is inserted into the resectoscope and is used to enucleate (free up) the enlarged prostate tissue from the capsule (outer shell) and then to seal up any blood vessels. The tissue that has been removed is pushed back into the bladder.  A morcellator is placed through the resectoscope, and is used to suction out the prostate tissue that has been pushed into the bladder.  When the prostate tissue has been removed, the resectoscope is removed, and a foley catheter is placed to allow healing and drain the urine from the bladder.     What happens after a HoLEP?  More than 90% of patients go home the same day a few hours after surgery. Less than 10% will be admitted to the hospital overnight for observation to monitor the urine, or if they have other medical problems.  Fluid is flushed through the catheter for about 1 hour after surgery to clear any blood from the urine. It is normal to have some blood in the urine after surgery. The need for blood transfusion is extremely rare.   Eating and drinking are permitted after the procedure once the patient has fully awakened from anesthesia.  The catheter is usually removed 2-3 days after surgery- the patient will come to clinic to have the catheter removed and make sure they can urinate on their own.  It is very important to drink lots of fluids after surgery for one week to keep the bladder flushed.  At first, there may be some burning with urination, but this typically improved within a few hours to days. Most patients do not have a significant amount of pain, and narcotic pain medications are rarely needed.  Symptoms of urinary frequency, urgency, and even leakage are NORMAL for the first few weeks after surgery as the bladder adjusts after having to work hard against blockage from the prostate for many years. This will improve, but can sometimes take several months.  The use of pelvic floor exercises (Kegel exercises) can help improve problems with urinary incontinence.   After catheter removal, patients will be seen at 6 weeks and 6 months for symptom check  No heavy lifting for at least 2-3 weeks after surgery, however patients can walk and do light activities the first day after surgery. Return to work time depends on occupation.    What are   the advantages of HoLEP?  HoLEP has been studied in many different parts of the world and has been shown to be a safe and effective procedure. Although there are many types of BPH surgeries available, HoLEP offers a unique advantage in being able to remove a large amount of tissue without any incisions on the body, even in very large prostates, while decreasing the risk of bleeding and providing tissue for pathology (to look for cancer). This decreases the need for blood transfusions during surgery, minimizes hospital stay, and reduces the risk of needing repeat treatment.  What are the side effects of HoLEP?  Temporary burning and bleeding during urination. Some blood may be  seen in the urine for weeks after surgery and is part of the healing process.  Urinary incontinence (inability to control urine flow) is expected in all patients immediately after surgery and they should wear pads for the first few days/weeks. This typically improves over the course of several weeks. Performing Kegel exercises can help decrease leakage from stress maneuvers such as coughing, sneezing, or lifting. The rate of long term leakage is very low. Patients may also have leakage with urgency and this may be treated with medication. The risk of urge incontinence can be dependent on several factors including age, prostate size, symptoms, and other medical problems.  Retrograde ejaculation or "backwards ejaculation." In 75% of cases, the patient will not see any fluid during ejaculation after surgery.  Erectile function is generally not significantly affected.   What are the risks of HoLEP?  Injury to the urethra or development of scar tissue at a later date  Injury to the capsule of the prostate (typically treated with longer catheterization).  Injury to the bladder or ureteral orifices (where the urine from the kidney drains out)  Infection of the bladder, testes, or kidneys  Return of urinary obstruction at a later date requiring another operation (<2%)  Need for blood transfusion or re-operation due to bleeding  Failure to relieve all symptoms and/or need for prolonged catheterization after surgery  5-15% of patients are found to have previously undiagnosed prostate cancer in their specimen. Prostate cancer can be treated after HoLEP.  Standard risks of anesthesia including blood clots, heart attacks, etc  When should I call my doctor?  Fever over 101.3 degrees  Inability to urinate, or large blood clots in the urine   

## 2022-04-10 NOTE — Progress Notes (Signed)
   04/10/2022 9:48 AM   Randy Shaffer April 02, 1951 [id number]  Reason for visit: Follow up elevated PSA, BPH/lower urinary tract symptoms  HPI: 71 year old male who I originally met on 03/06/2022 for elevated PSA of 4.48 and worsening urinary symptoms with incomplete bladder emptying and PVR of 400 mL.  He opted for a repeat PSA which fortunately decreased to normal at 3.2, and a trial of Flomax for his urinary symptoms.  He thinks the Flomax helped initially, but symptoms seem to have returned to his bothersome urinary dribbling, sensation of incomplete emptying, and urinary frequency.  PVR remains elevated today at 420 mL.  He is interested in other alternatives to improve the urination.  We discussed possible etiologies including BPH, urethral stricture, or atonic bladder.  BPH would be the most likely etiology.  I recommended cystoscopy and transrectal ultrasound for further evaluation.  Risk and benefits were discussed.  He also was recently seen by Dr. Lucky Cowboy and vascular surgery at the end of June 2023 for a significant left ICA stenosis, and was started on Plavix.  They are in the process of scheduling a carotid stenting procedure.  We discussed that the cystoscopy/TRUS can be performed prior to stenting and he can continue his anticoagulation, but that any surgical treatment for BPH would need to be performed after the carotid stenting procedure.  Follow-up for cystoscopy/TRUS-anticipate need for [place], MD  [facility] [address], [place], [place] [address] [phone]

## 2022-04-12 ENCOUNTER — Other Ambulatory Visit: Payer: Self-pay | Admitting: Urology

## 2022-04-12 ENCOUNTER — Telehealth (INDEPENDENT_AMBULATORY_CARE_PROVIDER_SITE_OTHER): Payer: Self-pay

## 2022-04-12 ENCOUNTER — Ambulatory Visit: Payer: Medicare Other | Admitting: Urology

## 2022-04-12 VITALS — BP 110/77 | HR 89 | Ht 76.0 in | Wt 217.7 lb

## 2022-04-12 DIAGNOSIS — N401 Enlarged prostate with lower urinary tract symptoms: Secondary | ICD-10-CM | POA: Diagnosis not present

## 2022-04-12 DIAGNOSIS — N138 Other obstructive and reflux uropathy: Secondary | ICD-10-CM

## 2022-04-12 DIAGNOSIS — R339 Retention of urine, unspecified: Secondary | ICD-10-CM

## 2022-04-12 MED ORDER — LIDOCAINE HCL URETHRAL/MUCOSAL 2 % EX GEL
1.0000 | Freq: Once | CUTANEOUS | Status: AC
  Administered 2022-04-12: 1 via URETHRAL

## 2022-04-12 NOTE — Telephone Encounter (Signed)
Spoke with the Randy Shaffer and he is scheduled with Dr. Wyn Quaker for a left carotid stent placement on 04/16/22 with a 6:45 am arrival time to the MM. Pre-procedure instructions were discussed and will be mailed. Randy Shaffer was called and given a new arrival time of 9:30 am for his angio on 04/16/22 with Dr. Wyn Quaker.

## 2022-04-12 NOTE — Patient Instructions (Signed)

## 2022-04-12 NOTE — Progress Notes (Signed)
Cystoscopy Procedure Note:  Indication: BPH with obstructive urinary symptoms, incomplete bladder emptying with PVRs greater than 400 mL.  PSA normal at 3.8.  After informed consent and discussion of the procedure and its risks, GOTTI ALWIN was positioned and prepped in the standard fashion. Cystoscopy was performed with a flexible cystoscope. The urethra, bladder neck and entire bladder was visualized in a standard fashion. The prostate was large with obstructing lateral lobes. The ureteral orifices were visualized in their normal location and orientation.  Moderate bladder trabeculations, no mucosal abnormalities, no abnormalities on retroflexion  Imaging: The transrectal ultrasound was inserted into the rectum and measurements taken for a calculated prostate volume of 54 g  Findings: Enlarged prostate, BPH  --------------------------------------------------------------------------------------------  Assessment and Plan: 71 year old male with bothersome urinary symptoms of weak stream, dribbling, sensation of incomplete emptying, and elevated PVRs greater than 400 mL.  Trialed Flomax with minimal to no improvement.  Cystoscopy and TRUS today show BPH as the etiology of his urinary symptoms and incomplete bladder emptying.  We reviewed outlet procedures ranging from UroLift, TURP, to HOLEP, and I recommended HOLEP with his enlarged gland and significant symptoms with incomplete bladder emptying to maximize symptom improvement.  We discussed the risks and benefits of HoLEP at length.  The procedure requires general anesthesia and takes 1 to 2 hours, and a holmium laser is used to enucleate the prostate and push this tissue into the bladder.  A morcellator is then used to remove this tissue, which is sent for pathology.  The vast majority(>95%) of patients are able to discharge the same day with a catheter in place for 2 to 3 days, and will follow-up in clinic for a voiding trial.  We  specifically discussed the risks of bleeding, infection, retrograde ejaculation, temporary urgency and urge incontinence, very low risk of long-term incontinence, urethral stricture/bladder neck contracture, pathologic evaluation of prostate tissue and possible detection of prostate cancer or other malignancy, and possible need for additional procedures.  He also is followed by vascular surgery and has an upcoming carotid procedure, currently is on Plavix.  Will need to be off anticoagulation for HOLEP 2 to 3 days prior, and likely can resume anticoagulation 2 to 3 days postop.  Will message Dr. Wyn Quaker regarding anticipated anticoagulation needs after upcoming procedure  Schedule HOLEP when able to hold anticoagulation 1 week  Legrand Rams, MD 04/12/2022

## 2022-04-12 NOTE — Progress Notes (Signed)
Surgical Physician Order Form [facility] [place]  * Scheduling expectation :  1-2 months  *Length of Case: 1.5 hours  *Clearance needed: no  *Anticoagulation Instructions: Hold all anticoagulants  *Aspirin Instructions: Hold Aspirin and Plavix  *Post-op visit Date/Instructions:  1-3 day cath removal  *Diagnosis: BPH w/BOO  *Procedure:     HOLEP ([id number])   Additional orders: N/A  -Admit type: OUTpatient  -Anesthesia: General  -VTE Prophylaxis Standing Order SCD's       Other:   -Standing Lab Orders Per Anesthesia    Lab other: UA&Urine Culture  -Standing Test orders EKG/Chest x-ray per Anesthesia       Test other:   - Medications:  Ancef 2gm IV  -Other orders: Patient having a vascular procedure 7/24 with Dr. Wyn Quaker, I have messaged him regarding anticoagulation needs, but anticipate will need to push HOLEP out 1 to 2 months until he can safely stop anticoagulation for 1 week

## 2022-04-16 ENCOUNTER — Encounter: Payer: Self-pay | Admitting: Vascular Surgery

## 2022-04-16 ENCOUNTER — Other Ambulatory Visit: Payer: Self-pay

## 2022-04-16 ENCOUNTER — Inpatient Hospital Stay
Admission: RE | Admit: 2022-04-16 | Discharge: 2022-04-17 | DRG: 036 | Disposition: A | Discharge status: Home / Self Care | Payer: Medicare Other | Attending: Vascular Surgery | Admitting: Vascular Surgery

## 2022-04-16 ENCOUNTER — Encounter
Admission: RE | Disposition: A | Discharge status: Home / Self Care | Payer: Self-pay | Source: Home / Self Care | Attending: Vascular Surgery

## 2022-04-16 DIAGNOSIS — E785 Hyperlipidemia, unspecified: Secondary | ICD-10-CM | POA: Diagnosis present

## 2022-04-16 DIAGNOSIS — I6522 Occlusion and stenosis of left carotid artery: Secondary | ICD-10-CM | POA: Diagnosis present

## 2022-04-16 DIAGNOSIS — F32A Depression, unspecified: Secondary | ICD-10-CM | POA: Diagnosis present

## 2022-04-16 DIAGNOSIS — Q2549 Other congenital malformations of aorta: Secondary | ICD-10-CM | POA: Diagnosis not present

## 2022-04-16 DIAGNOSIS — G4700 Insomnia, unspecified: Secondary | ICD-10-CM | POA: Diagnosis present

## 2022-04-16 DIAGNOSIS — Z79899 Other long term (current) drug therapy: Secondary | ICD-10-CM | POA: Diagnosis not present

## 2022-04-16 DIAGNOSIS — H9191 Unspecified hearing loss, right ear: Secondary | ICD-10-CM | POA: Diagnosis present

## 2022-04-16 DIAGNOSIS — Z7982 Long term (current) use of aspirin: Secondary | ICD-10-CM | POA: Diagnosis not present

## 2022-04-16 DIAGNOSIS — G4730 Sleep apnea, unspecified: Secondary | ICD-10-CM | POA: Diagnosis present

## 2022-04-16 DIAGNOSIS — M1711 Unilateral primary osteoarthritis, right knee: Secondary | ICD-10-CM | POA: Diagnosis present

## 2022-04-16 DIAGNOSIS — I10 Essential (primary) hypertension: Secondary | ICD-10-CM | POA: Diagnosis present

## 2022-04-16 DIAGNOSIS — I6523 Occlusion and stenosis of bilateral carotid arteries: Secondary | ICD-10-CM | POA: Diagnosis present

## 2022-04-16 DIAGNOSIS — Z888 Allergy status to other drugs, medicaments and biological substances status: Secondary | ICD-10-CM

## 2022-04-16 HISTORY — PX: CAROTID PTA/STENT INTERVENTION: CATH118231

## 2022-04-16 LAB — CREATININE, SERUM
Creatinine, Ser: 1.21 mg/dL (ref 0.61–1.24)
GFR, Estimated: >60 mL/min (ref >60)

## 2022-04-16 LAB — MRSA NEXT GEN BY PCR, NASAL: MRSA by PCR Next Gen: NOT DETECTED

## 2022-04-16 LAB — BUN: BUN: 14 mg/dL (ref 8–23)

## 2022-04-16 LAB — GLUCOSE, CAPILLARY: Glucose-Capillary: 113 mg/dL — ABNORMAL HIGH (ref 70–99)

## 2022-04-16 SURGERY — CAROTID PTA/STENT INTERVENTION
Anesthesia: Moderate Sedation | Laterality: Left

## 2022-04-16 MED ORDER — ONDANSETRON HCL 4 MG/2ML IJ SOLN: 4.0000 mg | Freq: Four times a day (QID) | INTRAMUSCULAR | Status: DC | PRN

## 2022-04-16 MED ORDER — FENTANYL CITRATE (PF) 100 MCG/2ML IJ SOLN
INTRAMUSCULAR | Status: DC | PRN
  Administered 2022-04-16: 50 ug via INTRAVENOUS

## 2022-04-16 MED ORDER — SODIUM CHLORIDE 0.9 % IV SOLN: 500.0000 mL | Freq: Once | INTRAVENOUS | Status: DC | PRN

## 2022-04-16 MED ORDER — HYDRALAZINE HCL 20 MG/ML IJ SOLN: 5.0000 mg | INTRAMUSCULAR | Status: DC | PRN

## 2022-04-16 MED ORDER — FAMOTIDINE 20 MG PO TABS: 40.0000 mg | ORAL_TABLET | Freq: Once | ORAL | Status: DC | PRN

## 2022-04-16 MED ORDER — ASPIRIN 81 MG PO TBEC
81.0000 mg | DELAYED_RELEASE_TABLET | Freq: Every day | ORAL | Status: DC
  Administered 2022-04-17: 81 mg via ORAL
  Filled 2022-04-16: qty 1

## 2022-04-16 MED ORDER — HEPARIN SODIUM (PORCINE) 1000 UNIT/ML IJ SOLN
INTRAMUSCULAR | Status: DC | PRN
  Administered 2022-04-16: 2000 [IU] via INTRAVENOUS
  Administered 2022-04-16: 7000 [IU] via INTRAVENOUS

## 2022-04-16 MED ORDER — METOPROLOL TARTRATE 5 MG/5ML IV SOLN: 2.0000 mg | INTRAVENOUS | Status: DC | PRN

## 2022-04-16 MED ORDER — ACETAMINOPHEN 325 MG RE SUPP: 325.0000 mg | RECTAL | Status: DC | PRN

## 2022-04-16 MED ORDER — HYDROMORPHONE HCL 1 MG/ML IJ SOLN: 1.0000 mg | Freq: Once | INTRAMUSCULAR | Status: DC | PRN

## 2022-04-16 MED ORDER — HEPARIN SODIUM (PORCINE) 1000 UNIT/ML IJ SOLN
INTRAMUSCULAR | Status: AC
  Filled 2022-04-16: qty 10

## 2022-04-16 MED ORDER — FENTANYL CITRATE (PF) 100 MCG/2ML IJ SOLN
INTRAMUSCULAR | Status: AC
  Filled 2022-04-16: qty 2

## 2022-04-16 MED ORDER — FLUOXETINE HCL 20 MG PO CAPS
40.0000 mg | ORAL_CAPSULE | Freq: Every day | ORAL | Status: DC
  Administered 2022-04-16 – 2022-04-17 (×2): 40 mg via ORAL
  Filled 2022-04-16 (×2): qty 2

## 2022-04-16 MED ORDER — IODIXANOL 320 MG/ML IV SOLN
INTRAVENOUS | Status: DC | PRN
  Administered 2022-04-16: 50 mL via INTRA_ARTERIAL

## 2022-04-16 MED ORDER — ACETAMINOPHEN 325 MG PO TABS: 325.0000 mg | ORAL_TABLET | ORAL | Status: DC | PRN

## 2022-04-16 MED ORDER — METHYLPREDNISOLONE SODIUM SUCC 125 MG IJ SOLR: 125.0000 mg | Freq: Once | INTRAMUSCULAR | Status: DC | PRN

## 2022-04-16 MED ORDER — DOPAMINE-DEXTROSE 3.2-5 MG/ML-% IV SOLN
INTRAVENOUS | Status: AC
  Filled 2022-04-16: qty 250

## 2022-04-16 MED ORDER — LABETALOL HCL 5 MG/ML IV SOLN: 10.0000 mg | INTRAVENOUS | Status: DC | PRN

## 2022-04-16 MED ORDER — CEFAZOLIN SODIUM-DEXTROSE 2-4 GM/100ML-% IV SOLN
2.0000 g | Freq: Three times a day (TID) | INTRAVENOUS | Status: AC
  Administered 2022-04-16 – 2022-04-17 (×2): 2 g via INTRAVENOUS
  Filled 2022-04-16 (×2): qty 100

## 2022-04-16 MED ORDER — ATORVASTATIN CALCIUM 20 MG PO TABS: 20.0000 mg | ORAL_TABLET | Freq: Every evening | ORAL | Status: DC

## 2022-04-16 MED ORDER — MIDAZOLAM HCL 2 MG/2ML IJ SOLN
INTRAMUSCULAR | Status: DC | PRN
  Administered 2022-04-16: 2 mg via INTRAVENOUS

## 2022-04-16 MED ORDER — MIDAZOLAM HCL 2 MG/ML PO SYRP: 8.0000 mg | ORAL_SOLUTION | Freq: Once | ORAL | Status: DC | PRN

## 2022-04-16 MED ORDER — GUAIFENESIN-DM 100-10 MG/5ML PO SYRP: 15.0000 mL | ORAL_SOLUTION | ORAL | Status: DC | PRN

## 2022-04-16 MED ORDER — TAMSULOSIN HCL 0.4 MG PO CAPS
0.4000 mg | ORAL_CAPSULE | Freq: Every day | ORAL | Status: DC
  Administered 2022-04-17: 0.4 mg via ORAL
  Filled 2022-04-16: qty 1

## 2022-04-16 MED ORDER — DIPHENHYDRAMINE HCL 50 MG/ML IJ SOLN: 50.0000 mg | Freq: Once | INTRAMUSCULAR | Status: DC | PRN

## 2022-04-16 MED ORDER — CEFAZOLIN SODIUM-DEXTROSE 2-4 GM/100ML-% IV SOLN
INTRAVENOUS | Status: AC
  Administered 2022-04-16: 2 g via INTRAVENOUS
  Filled 2022-04-16: qty 100

## 2022-04-16 MED ORDER — MORPHINE SULFATE (PF) 4 MG/ML IV SOLN: 2.0000 mg | INTRAVENOUS | Status: DC | PRN

## 2022-04-16 MED ORDER — CEFAZOLIN SODIUM-DEXTROSE 2-4 GM/100ML-% IV SOLN: 2.0000 g | INTRAVENOUS | Status: AC

## 2022-04-16 MED ORDER — MIDAZOLAM HCL 5 MG/5ML IJ SOLN
INTRAMUSCULAR | Status: AC
  Filled 2022-04-16: qty 5

## 2022-04-16 MED ORDER — ATROPINE SULFATE 1 MG/10ML IJ SOSY
PREFILLED_SYRINGE | INTRAMUSCULAR | Status: AC
  Filled 2022-04-16: qty 20

## 2022-04-16 MED ORDER — SODIUM CHLORIDE 0.9 % IV SOLN: INTRAVENOUS | Status: DC

## 2022-04-16 MED ORDER — POTASSIUM CHLORIDE CRYS ER 20 MEQ PO TBCR: 20.0000 meq | EXTENDED_RELEASE_TABLET | Freq: Every day | ORAL | Status: DC | PRN

## 2022-04-16 MED ORDER — PHENYLEPHRINE HCL-NACL 20-0.9 MG/250ML-% IV SOLN
INTRAVENOUS | Status: AC
  Filled 2022-04-16: qty 250

## 2022-04-16 MED ORDER — ATROPINE SULFATE 1 MG/10ML IJ SOSY
PREFILLED_SYRINGE | INTRAMUSCULAR | Status: DC | PRN
  Administered 2022-04-16: 1 mg via INTRAVENOUS

## 2022-04-16 MED ORDER — OXYCODONE-ACETAMINOPHEN 5-325 MG PO TABS: 1.0000 | ORAL_TABLET | ORAL | Status: DC | PRN

## 2022-04-16 MED ORDER — MAGNESIUM SULFATE 2 GM/50ML IV SOLN: 2.0000 g | Freq: Every day | INTRAVENOUS | Status: DC | PRN

## 2022-04-16 MED ORDER — SODIUM CHLORIDE 0.9 % IV SOLN
INTRAVENOUS | Status: DC
Start: 2022-04-16 — End: 2022-04-16

## 2022-04-16 MED ORDER — ALUM & MAG HYDROXIDE-SIMETH 200-200-20 MG/5ML PO SUSP: 15.0000 mL | ORAL | Status: DC | PRN

## 2022-04-16 MED ORDER — CLOPIDOGREL BISULFATE 75 MG PO TABS
75.0000 mg | ORAL_TABLET | Freq: Every day | ORAL | Status: DC
  Administered 2022-04-17: 75 mg via ORAL
  Filled 2022-04-16: qty 1

## 2022-04-16 MED ORDER — TRAZODONE HCL 50 MG PO TABS
75.0000 mg | ORAL_TABLET | Freq: Every evening | ORAL | Status: DC | PRN
Start: 2022-04-16 — End: 2022-04-17

## 2022-04-16 MED ORDER — PHENOL 1.4 % MT LIQD
1.0000 | OROMUCOSAL | Status: DC | PRN
Start: 2022-04-16 — End: 2022-04-17

## 2022-04-16 MED ORDER — FAMOTIDINE IN NACL 20-0.9 MG/50ML-% IV SOLN
20.0000 mg | Freq: Two times a day (BID) | INTRAVENOUS | Status: DC
  Administered 2022-04-16 (×2): 20 mg via INTRAVENOUS
  Filled 2022-04-16 (×2): qty 50

## 2022-04-16 SURGICAL SUPPLY — 20 items
BALLN VTRAC 4.5X20X135 (BALLOONS) ×2
BALLOON VTRAC 4.5X20X135 (BALLOONS) IMPLANT
CATH ANGIO 5F PIGTAIL 100CM (CATHETERS) ×1 IMPLANT
CATH BEACON 5 .035 100 H1 TIP (CATHETERS) ×1 IMPLANT
COVER PROBE U/S 5X48 (MISCELLANEOUS) ×1 IMPLANT
DEVICE EMBOSHIELD NAV6 4.0-7.0 (FILTER) ×1 IMPLANT
DEVICE SAFEGUARD 24CM (GAUZE/BANDAGES/DRESSINGS) ×1 IMPLANT
DEVICE STARCLOSE SE CLOSURE (Vascular Products) ×1 IMPLANT
DEVICE TORQUE .025-.038 (MISCELLANEOUS) ×1 IMPLANT
GLIDEWIRE ANGLED SS 035X260CM (WIRE) ×1 IMPLANT
GUIDEWIRE VASC STIFF .038X260 (WIRE) ×1 IMPLANT
KIT CAROTID MANIFOLD (MISCELLANEOUS) ×1 IMPLANT
KIT ENCORE 26 ADVANTAGE (KITS) ×1 IMPLANT
PACK ANGIOGRAPHY (CUSTOM PROCEDURE TRAY) ×2 IMPLANT
SHEATH BRITE TIP 6FRX11 (SHEATH) ×2 IMPLANT
SHEATH SHUTTLE SELECT 6F (SHEATH) ×1 IMPLANT
STENT XACT CAR 9-7X40X136 (Permanent Stent) ×1 IMPLANT
SYR MEDRAD MARK 7 150ML (SYRINGE) ×1 IMPLANT
TUBING CONTRAST HIGH PRESS 72 (TUBING) ×1 IMPLANT
WIRE GUIDERIGHT .035X150 (WIRE) ×1 IMPLANT

## 2022-04-16 NOTE — Progress Notes (Signed)
Will schedule after vascular procedure, needs 90 days of Anti-Coags before surgery can be performed

## 2022-04-16 NOTE — Op Note (Signed)
OPERATIVE NOTE DATE: 04/16/2022  PROCEDURE:  Ultrasound guidance for vascular access right femoral artery  Placement of a 9 mm proximal, 7 mm distal, 4 cm long exact stent with the use of the NAV-6 embolic protection device in the left carotid artery  PRE-OPERATIVE DIAGNOSIS: 1.  High-grade left carotid artery stenosis.   POST-OPERATIVE DIAGNOSIS:  Same as above  SURGEON: Festus Barren, MD  ASSISTANT(S): None  ANESTHESIA: local/MCS  ESTIMATED BLOOD LOSS: 15 cc  CONTRAST: 50 cc  FLUORO TIME: 3.5 minutes  MODERATE CONSCIOUS SEDATION TIME:  Approximately 41 minutes using 2 mg of Versed and 50 mcg of Fentanyl  FINDING(S): 1.   Approximately 80% left internal carotid artery stenosis 1 to 2 cm above the bifurcation and a focal location  SPECIMEN(S):   none  INDICATIONS:   [name] who presents with high-grade left carotid artery stenosis.  The patient has a somewhat high lesion well above the bifurcation and carotid artery stenting was felt to be preferred to endarterectomy for that reason.  Risks and benefits were discussed and informed consent was obtained.   DESCRIPTION: After obtaining full informed written consent, the patient was brought back to the vascular suite and placed supine upon the table.  The patient received IV antibiotics prior to induction. Moderate conscious sedation was administered during a face to face encounter with the patient throughout the procedure with my supervision of the RN administering medicines and monitoring the patients vital signs and mental status throughout from the start of the procedure until the patient was taken to the recovery room.  After obtaining adequate anesthesia, the patient was prepped and draped in the standard fashion.   The right femoral artery was visualized with ultrasound and found to be widely patent. It was then accessed under direct ultrasound guidance without difficulty with a Seldinger needle. A permanent  image was recorded. A J-wire was placed and we then placed a 6 French sheath. The patient was then heparinized and a total of 9000 units of intravenous heparin were given and an ACT was checked to confirm successful anticoagulation. A pigtail catheter was then placed into the ascending aorta. This showed a type II aortic arch without proximal stenoses. I then selectively cannulated the left common carotid artery without difficulty with a headhunter catheter and advanced into the mid left common carotid artery.  Cervical and cerebral carotid angiography was then performed. There were no obvious intracranial filling defects with a small amount of cross-filling. The carotid bifurcation demonstrated an interesting lesion about 1 to 2 cm above the origin of the internal carotid artery with a focal napkin ringlike stenosis in the 80% range.  I then advanced into the external carotid artery with a Glidewire and the headhunter catheter and then exchanged for the Amplatz Super Stiff wire. Over the Amplatz Super Stiff wire, a 6 [place] shuttle sheath was placed into the mid common carotid artery. I then used the NAV-6  Embolic protection device and crossed the lesion and parked this in the distal internal carotid artery at the base of the skull.  I then selected a 9 mm proximal, 7 mm distal, 4 cm long exact stent. This was deployed across the lesion encompassing it in its entirety. A 4.5 mm diameter by 2 cm length balloon was used to post dilate the stent. Only about a 10-15% residual stenosis was present after angioplasty. Completion angiogram showed normal intracranial filling without new defects. At this point I elected to terminate  the procedure. The sheath was removed and StarClose closure device was deployed in the right femoral artery with excellent hemostatic result. The patient was taken to the recovery room in stable condition having tolerated the procedure well.  COMPLICATIONS: none  CONDITION: stable  Festus Barren 04/16/2022 12:02 PM   This note was created with Dragon Medical transcription system. Any errors in dictation are purely unintentional.

## 2022-04-16 NOTE — Progress Notes (Signed)
   04/16/22 1300  Clinical Encounter Type  Visited With Patient and family together  Visit Type Initial  Referral From Chaplain  Consult/Referral To Chaplain   Chaplain provided support to wife after patient's procedure and shared that Chaplain services are available upon request if desired.

## 2022-04-16 NOTE — Progress Notes (Signed)
Called to bedside by RN after noticing a oozing from PAD device.  Original PAD removed.  Lidocaine with epi applied around incision area.  Pressure held.  No significant bleeding noted.  PAD reapplied.  Patient tolerated well.

## 2022-04-16 NOTE — Interval H&P Note (Signed)
History and Physical Interval Note:  04/16/2022 10:08 AM  Randy Shaffer  has presented today for surgery, with the diagnosis of L Carotid Stent Placement   ABBOTT  Carotid artery stenosis.  The various methods of treatment have been discussed with the patient and family. After consideration of risks, benefits and other options for treatment, the patient has consented to  Procedure(s): CAROTID PTA/STENT INTERVENTION (Left) as a surgical intervention.  The patient's history has been reviewed, patient examined, no change in status, stable for surgery.  I have reviewed the patient's chart and labs.  Questions were answered to the patient's satisfaction.     Festus Barren

## 2022-04-17 LAB — CBC
HCT: 42 % (ref 39.0–52.0)
Hemoglobin: 14.3 g/dL (ref 13.0–17.0)
MCH: 29.1 pg (ref 26.0–34.0)
MCHC: 34 g/dL (ref 30.0–36.0)
MCV: 85.4 fL (ref 80.0–100.0)
Platelets: 167 10*3/uL (ref 150–400)
RBC: 4.92 MIL/uL (ref 4.22–5.81)
RDW: 13 % (ref 11.5–15.5)
WBC: 6.2 10*3/uL (ref 4.0–10.5)
nRBC: 0 % (ref 0.0–0.2)

## 2022-04-17 LAB — BASIC METABOLIC PANEL
Anion gap: 5 (ref 5–15)
BUN: 13 mg/dL (ref 8–23)
CO2: 25 mmol/L (ref 22–32)
Calcium: 8.2 mg/dL — ABNORMAL LOW (ref 8.9–10.3)
Chloride: 107 mmol/L (ref 98–111)
Creatinine, Ser: 1.18 mg/dL (ref 0.61–1.24)
GFR, Estimated: >60 mL/min (ref >60)
Glucose, Bld: 117 mg/dL — ABNORMAL HIGH (ref 70–99)
Potassium: 4.1 mmol/L (ref 3.5–5.1)
Sodium: 137 mmol/L (ref 135–145)

## 2022-04-17 MED ORDER — FAMOTIDINE 20 MG PO TABS
20.0000 mg | ORAL_TABLET | Freq: Two times a day (BID) | ORAL | Status: DC
  Administered 2022-04-17: 20 mg via ORAL
  Filled 2022-04-17: qty 1

## 2022-04-17 MED ORDER — CHLORHEXIDINE GLUCONATE CLOTH 2 % EX PADS
6.0000 | MEDICATED_PAD | Freq: Every day | CUTANEOUS | Status: DC
  Administered 2022-04-17: 6 via TOPICAL

## 2022-04-17 MED ORDER — OXYCODONE-ACETAMINOPHEN 5-325 MG PO TABS
1.0000 | ORAL_TABLET | Freq: Four times a day (QID) | ORAL | 0 refills | Status: DC | PRN
Start: 2022-04-17 — End: 2022-08-21

## 2022-04-17 NOTE — Progress Notes (Signed)
PHARMACIST - PHYSICIAN COMMUNICATION  CONCERNING: IV to Oral Route Change Policy  RECOMMENDATION: This patient is receiving famotidine by the intravenous route.  Based on criteria approved by the Pharmacy and Therapeutics Committee, the intravenous medication(s) is/are being converted to the equivalent oral dose form(s).   DESCRIPTION: These criteria include: The patient is eating (either orally or via tube) and/or has been taking other orally administered medications for a least 24 hours The patient has no evidence of active gastrointestinal bleeding or impaired GI absorption (gastrectomy, short bowel, patient on TNA or NPO).  If you have questions about this conversion, please contact the Pharmacy Department   Tressie Ellis, [facility] 04/17/2022 7:47 AM

## 2022-04-17 NOTE — Discharge Summary (Signed)
[facility]    Discharge Summary    Patient ID:  Randy Shaffer MRN: [id number] DOB/AGE: Dec 17, 1950 71 y.o.  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  Prior to intervention the patient had an 80% left internal carotid artery stenosis.  Following intervention there was some slight oozing from his right groin.  Following application of lidocaine with epi there were no further issues.  Overall patient is doing well  Hospital Course:  Randy Shaffer is a 71 y.o. male is S/P left carotid stent placement Procedure(s): CAROTID PTA/STENT INTERVENTION Extubated: POD # 0 Physical exam: Groin wound clean dry and intact 2+ palpable pulses no neurological deficits Post-op wounds clean, dry, intact or healing well Pt. Ambulating, voiding and taking PO diet without difficulty. Pt pain controlled with PO pain meds. Labs as below Complications:none  Consults:    Significant Diagnostic Studies: CBC Lab Results  Component Value Date   WBC 6.2 04/17/2022   HGB 14.3 04/17/2022   HCT 42.0 04/17/2022   MCV 85.4 04/17/2022   PLT 167 04/17/2022    BMET    Component  Value Date/Time   NA 137 04/17/2022 0524   NA 144 02/16/2013 1623   K 4.1 04/17/2022 0524   K 4.2 02/16/2013 1623   CL 107 04/17/2022 0524   CL 108 (H) 02/16/2013 1623   CO2 25 04/17/2022 0524   CO2 29 02/16/2013 1623   GLUCOSE 117 (H) 04/17/2022 0524   GLUCOSE 103 (H) 02/16/2013 1623   BUN 13 04/17/2022 0524   BUN 24 (H) 02/16/2013 1623   CREATININE 1.18 04/17/2022 0524   CREATININE 1.29 02/16/2013 1623   CALCIUM 8.2 (L) 04/17/2022 0524   CALCIUM 9.6 02/16/2013 1623   GFRNONAA >60 04/17/2022 0524   GFRNONAA 59 (L) 02/16/2013 1623   GFRAA >60 02/16/2013 1623   COAG No results found for: "INR", "PROTIME"   Disposition:  Discharge to :Home  Allergies as of 04/17/2022       Reactions   Sodium Pantothenate [pantothenic Acid] Nausea And Vomiting   SEVERE VOMITING        Medication List     TAKE these medications    aspirin EC 81 MG tablet Take 81 mg by mouth daily. Swallow whole.   atorvastatin 40 MG tablet Commonly known as: LIPITOR Take 20 mg by mouth in the morning.   clopidogrel 75 MG tablet Commonly known as: PLAVIX Take 1 tablet (75 mg total) by mouth daily.   FLUoxetine 20 MG capsule Commonly known as: PROZAC Take 40 mg by mouth in the morning.   oxyCODONE-acetaminophen 5-325 MG tablet Commonly known as: PERCOCET/ROXICET  Take 1 tablet by mouth every 6 (six) hours as needed for moderate pain.   tamsulosin 0.4 MG Caps capsule Commonly known as: FLOMAX Take 1 capsule (0.4 mg total) by mouth daily.   traZODone 100 MG tablet Commonly known as: DESYREL Take 75 mg by mouth at bedtime as needed for sleep. 75mg  per patient       Verbal and written Discharge instructions given to the patient. Wound care per Discharge AVS  Follow-up Information     Dew, , MD Follow up in 3 week(s).   Specialties: Vascular Surgery, Radiology, Interventional Cardiology Why: See JD/FB in 3-4 weeks with carotid Contact information: [id number]  [place] [address]                 Signed: [phone], NP  04/17/2022, 12:05 PM

## 2022-04-18 LAB — POCT ACTIVATED CLOTTING TIME: Activated Clotting Time: 263 seconds

## 2022-05-17 ENCOUNTER — Other Ambulatory Visit (INDEPENDENT_AMBULATORY_CARE_PROVIDER_SITE_OTHER): Payer: Self-pay | Admitting: Vascular Surgery

## 2022-05-17 DIAGNOSIS — I6523 Occlusion and stenosis of bilateral carotid arteries: Secondary | ICD-10-CM

## 2022-05-22 ENCOUNTER — Ambulatory Visit (INDEPENDENT_AMBULATORY_CARE_PROVIDER_SITE_OTHER): Payer: Medicare Other | Admitting: Vascular Surgery

## 2022-05-22 ENCOUNTER — Ambulatory Visit (INDEPENDENT_AMBULATORY_CARE_PROVIDER_SITE_OTHER): Payer: Medicare Other

## 2022-05-22 ENCOUNTER — Encounter (INDEPENDENT_AMBULATORY_CARE_PROVIDER_SITE_OTHER): Payer: Self-pay | Admitting: Vascular Surgery

## 2022-05-22 VITALS — BP 103/66 | HR 59 | Resp 12 | Ht 76.0 in | Wt 227.0 lb

## 2022-05-22 DIAGNOSIS — I10 Essential (primary) hypertension: Secondary | ICD-10-CM

## 2022-05-22 DIAGNOSIS — I6523 Occlusion and stenosis of bilateral carotid arteries: Secondary | ICD-10-CM

## 2022-05-22 DIAGNOSIS — E785 Hyperlipidemia, unspecified: Secondary | ICD-10-CM

## 2022-05-22 NOTE — Progress Notes (Signed)
Patient ID: Randy Shaffer, male   DOB: 05-19-1951, 71 y.o.   MRN: [id number]  Chief Complaint  Patient presents with   Follow-up    Ultrasound f/u from hospital    HPI Randy Shaffer is a 71 y.o. male.  Patient returns in follow-up of his carotid disease.  He is about a month status post left carotid stent placement for high-grade stenosis with nonfocal symptoms such as dizziness and syncope.  He still has some dizziness but it sounds like his symptoms have been better since the procedure.  He had no postprocedural complications.  His duplex today shows minimal right carotid stenosis in the low end of the 1 to 39% range and a widely patent left carotid stent.   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   Insomnia    Knee pain, right    COMPLEX TEAR OF MEDIAL MENISCUS   PONV (postoperative nausea and vomiting)    SEVERE   Sleep apnea     Past Surgical History:  Procedure Laterality Date   AMPUTATION FINGER / THUMB Right    THUMB PLUS 3 RECONSTRUCTIVE SURGERIES   CAROTID PTA/STENT INTERVENTION Left 04/16/2022   Procedure: CAROTID PTA/STENT INTERVENTION;  Surgeon: Annice Needy, MD;  Location: [facility] INVASIVE CV LAB;  Service: Cardiovascular;  Laterality: Left;   HEAD LACERATION REPAIR     HERNIA REPAIR  2000   ONE AS INFANT   HIP NERVE RELEASE     KNEE ARTHROSCOPY Right 2003   KNEE ARTHROSCOPY Right 02/23/2015   Procedure: ARTHROSCOPY KNEE WITH DEBRIDEMENT PARTIAL MEDIAL MENISECTOMY;  Surgeon: Christena Flake, MD;  Location: [facility];  Service: Orthopedics;  Laterality: Right;   PHOTOREFRACTIVE KERATOTOMY     RIGHT/LEFT HEART CATH AND CORONARY ANGIOGRAPHY N/A 12/13/2021   Procedure: RIGHT/LEFT HEART CATH AND CORONARY ANGIOGRAPHY;  Surgeon: Alwyn Pea, MD;  Location: [facility]  INVASIVE CV LAB;  Service: Cardiovascular;  Laterality: N/A;      Allergies  Allergen Reactions   Sodium Pantothenate [Pantothenic Acid] Nausea And Vomiting    SEVERE VOMITING    Current Outpatient Medications  Medication Sig Dispense Refill   aspirin EC 81 MG tablet Take 81 mg by mouth daily. Swallow whole.     atorvastatin (LIPITOR) 40 MG tablet Take 20 mg by mouth in the morning.     clopidogrel (PLAVIX) 75 MG tablet Take 1 tablet (75 mg total) by mouth daily. 30 tablet 6   FLUoxetine (PROZAC) 20 MG capsule Take 40 mg by mouth in the morning.     tamsulosin (FLOMAX) 0.4 MG CAPS capsule Take 1 capsule (0.4 mg total) by mouth daily. 30 capsule 11   traZODone (DESYREL) 100 MG tablet Take 75 mg by mouth at bedtime as needed for sleep. 75mg  per patient     oxyCODONE-acetaminophen (PERCOCET/ROXICET) 5-325 MG tablet Take 1 tablet by mouth every 6 (six) hours as needed for moderate pain. (Patient not taking: Reported on 05/22/2022) 20 tablet 0   No current facility-administered medications for this visit.        Physical Exam BP 103/66 (BP Location: Left Arm)   Pulse (!) 59   Resp 12   Ht 6\' 4"  (1.93 m)   Wt 227 lb (103  kg)   BMI 27.63 kg/m  Gen:  WD/WN, NAD Skin: incision C/D/I Neuro: normal strength and tone. No focal deficits.     Assessment/Plan:  Hypertension blood pressure control important in reducing the progression of atherosclerotic disease.  His blood pressure has been running lower since the procedure and he actually feels dizzy sometimes.  I asked him to discuss his blood pressure regimen with his primary care physician.   Hyperlipidemia lipid control important in reducing the progression of atherosclerotic disease. Continue statin therapy   Carotid stenosis His duplex today shows minimal right carotid stenosis in the low end of the 1 to 39% range and a widely patent left carotid stent.  Continue current medical regimen including aspirin, Plavix,  atorvastatin.  Recheck in 3 months with duplex.      Festus Barren 05/22/2022, 11:19 AM   This note was created with Dragon medical transcription system.  Any errors from dictation are unintentional.

## 2022-05-22 NOTE — Assessment & Plan Note (Addendum)
blood pressure control important in reducing the progression of atherosclerotic disease.  His blood pressure has been running lower since the procedure and he actually feels dizzy sometimes.  I asked him to discuss his blood pressure regimen with his primary care physician.

## 2022-05-22 NOTE — Assessment & Plan Note (Signed)
His duplex today shows minimal right carotid stenosis in the low end of the 1 to 39% range and a widely patent left carotid stent.  Continue current medical regimen including aspirin, Plavix, atorvastatin.  Recheck in 3 months with duplex.

## 2022-05-22 NOTE — Assessment & Plan Note (Signed)
lipid control important in reducing the progression of atherosclerotic disease. Continue statin therapy  

## 2022-06-13 ENCOUNTER — Telehealth: Payer: Self-pay

## 2022-06-13 NOTE — Progress Notes (Signed)
[facility] Posting Form   Surgery Date/Time: Date: 08/03/2022  Surgeon: Dr. Nickolas Madrid, MD  Surgery Location: Day Surgery  Inpt ( No  )   Outpt (Yes)   Obs ( No  )   Diagnosis: N40.1, N13.8 Benign Prostatic Hyperplasia with Urinary Obstruction  -CPT: 89211  Surgery: Holmium Laser Enucleation of the Prostate  Stop Anticoagulations: Yes, Hold ASA and Plavix as well  Cardiac/Medical/Pulmonary Clearance needed: yes  Clearance needed from Dr: Lucky Cowboy  Clearance request sent on: Date: 06/13/22   *Orders entered into EPIC  Date: 06/13/22   *Case booked in EPIC  Date: 06/08/2022  *Notified pt of Surgery: Date: 06/08/2022  PRE-OP UA & CX: yes, will obtain in clinic on 07/20/2022  *Placed into Prior Authorization Work Fabio Bering Date: 06/13/22   Assistant/laser/rep:No

## 2022-06-13 NOTE — Telephone Encounter (Signed)
I spoke with Mr. Guile. We have discussed possible surgery dates and Friday November 10th, 2023 was agreed upon by all parties. Patient given information about surgery date, what to expect pre-operatively and post operatively.  We discussed that a Pre-Admission Testing office will be calling to set up the pre-op visit that will take place prior to surgery, and that these appointments are typically done over the phone with a Pre-Admissions RN.  Informed patient that our office will communicate any additional care to be provided after surgery. Patients questions or concerns were discussed during our call. Advised to call our office should there be any additional information, questions or concerns that arise. Patient verbalized understanding.

## 2022-07-12 ENCOUNTER — Telehealth: Payer: Self-pay

## 2022-07-12 NOTE — Progress Notes (Signed)
===  View-only below this line=== ----- Message ----- From: Algernon Huxley, MD Sent: 07/12/2022   3:11 PM EDT To: Gerald Leitz, CMA  He is low risk.  He can hold Plavix for 5 days prior and resume the day after.  Corene Cornea ----- Message ----- From: Gerald Leitz, [place] Sent: 07/12/2022  11:31 AM EDT To: Algernon Huxley, MD  **Surgical Clearance**

## 2022-07-12 NOTE — Telephone Encounter (Signed)
REQUEST FOR SURGICAL CLEARANCE       Date: 07/12/2022  Faxed to: Dr. Leotis Pain, MD  Surgeon: Dr. Nickolas Madrid, MD     Date of Surgery: 08/10/2022  Operation: Holmium Laser Enucleation of the Prostate  Anesthesia Type: General   Diagnosis: Benign Prostatic Hyperplasia with Urinary Obstruction  Patient Requires:   Cardiac / Vascular Clearance : Yes  Reason: Patient with recent vascular surgery, with recent discontinuation of anticoagulant therapy. Would like to know if patient is optimized for urologic surgery.   Risk Assessment:    Low   []       Moderate   []     High   []           This patient is optimized for surgery  YES []       NO   []    I recommend further assessment/workup prior to surgery. YES []      NO  []   Appointment scheduled for: _______________________   Further recommendations: ____________________________________     Physician Signature:__________________________________   Printed Name: ________________________________________   Date: _________________

## 2022-07-20 ENCOUNTER — Other Ambulatory Visit: Payer: Medicare Other

## 2022-07-20 DIAGNOSIS — N138 Other obstructive and reflux uropathy: Secondary | ICD-10-CM

## 2022-07-20 LAB — MICROSCOPIC EXAMINATION: RBC, Urine: >30 /hpf — AB (ref 0–2)

## 2022-07-20 LAB — URINALYSIS, COMPLETE
Bilirubin, UA: NEGATIVE
Glucose, UA: NEGATIVE
Ketones, UA: NEGATIVE
Leukocytes,UA: NEGATIVE
Nitrite, UA: NEGATIVE
Protein,UA: NEGATIVE
Specific Gravity, UA: 1.01 (ref 1.005–1.030)
Urobilinogen, Ur: 0.2 mg/dL (ref 0.2–1.0)
pH, UA: 6.5 (ref 5.0–7.5)

## 2022-07-23 ENCOUNTER — Encounter (INDEPENDENT_AMBULATORY_CARE_PROVIDER_SITE_OTHER): Payer: Self-pay

## 2022-07-24 LAB — CULTURE, URINE COMPREHENSIVE

## 2022-07-27 ENCOUNTER — Encounter
Admission: RE | Admit: 2022-07-27 | Discharge: 2022-07-27 | Disposition: A | Discharge status: Home / Self Care | Payer: Medicare Other | Source: Ambulatory Visit | Attending: Urology | Admitting: Urology

## 2022-07-27 ENCOUNTER — Other Ambulatory Visit: Payer: Self-pay

## 2022-07-27 HISTORY — DX: Essential (primary) hypertension: I10

## 2022-07-27 HISTORY — DX: Prediabetes: R73.03

## 2022-07-27 NOTE — Patient Instructions (Addendum)
Your procedure is scheduled on: 08/03/22 Report to DAY SURGERY DEPARTMENT LOCATED ON 2ND FLOOR MEDICAL MALL ENTRANCE. To find out your arrival time please call [phone] between 1PM - 3PM on 08/02/22.  Remember: Instructions that are not followed completely may result in serious medical risk, up to and including death, or upon the discretion of your surgeon and anesthesiologist your surgery may need to be rescheduled.     _X__ 1. Do not eat food or drink any liquids after midnight the night before your procedure.                 No gum chewing or hard candies.   __X__2.  On the morning of surgery brush your teeth with toothpaste and water, you                 may rinse your mouth with mouthwash if you wish.  Do not swallow any              toothpaste of mouthwash.     _X__ 3.  No Alcohol for 24 hours before or after surgery.   _X__ 4.  Do Not Smoke or use e-cigarettes For 24 Hours Prior to Your Surgery.                 Do not use any chewable tobacco products for at least 6 hours prior to                 surgery.  ____  5.  Bring all medications with you on the day of surgery if instructed.   __X__  6.  Notify your doctor if there is any change in your medical condition      (cold, fever, infections).     Do not wear jewelry, make-up, hairpins, clips or nail polish. Do not wear lotions, powders, or perfumes. You may wear deodorant. Do not shave body hair 48 hours prior to surgery. Men may shave face and neck. Do not bring valuables to the hospital.    [facility] is not responsible for any belongings or valuables.  Contacts, dentures/partials or body piercings may not be worn into surgery. Bring a case for your contacts, glasses or hearing aids, a denture cup will be supplied. Leave your suitcase in the car. After surgery it may be brought to your room. For patients admitted to the hospital, discharge time is determined by your treatment team.   Patients discharged the day of  surgery will not be allowed to drive home.    __X__ Take these medicines the morning of surgery with A SIP OF WATER:    1. atorvastatin (LIPITOR) 40 MG tablet   2. FLUoxetine (PROZAC) 20 MG capsule   3. tamsulosin (FLOMAX) 0.4 MG CAPS capsule   4.  5.  6.  ____ Fleet Enema (as directed)   ____ Use CHG Soap/SAGE wipes as directed  ____ Use inhalers on the day of surgery  ____ Stop metformin/Janumet/Farxiga 2 days prior to surgery    ____ Take 1/2 of usual insulin dose the night before surgery. No insulin the morning          of surgery.   __X__ Stop Plavix as instructed    __X__ Stop Anti-inflammatories 7 days before surgery such as Advil, Ibuprofen, Motrin,  BC or Goodies Powder, Naprosyn, Naproxen, Aleve   __X__ Stop all herbal supplements, fish oil or vitamins  until after surgery.    ____ Bring C-Pap to the hospital.  Indwelling Urinary Catheter Care, Adult An indwelling urinary catheter is a thin, flexible tube that is placed into the bladder to help drain urine out of the body. The catheter is inserted into the urethra. The urethra is the part of the body that drains urine from the bladder. Urine drains from the catheter into a drainage bag outside of the body. Taking good care of your catheter will keep it working properly and help to prevent problems from developing. What are the risks? Bacteria may get into your bladder and cause a urinary tract infection. Urine flow can become blocked. This can happen if the catheter is not working correctly, or if you have sediment or a blood clot in your bladder or catheter. Tissue near the catheter may become irritated and may bleed. How to wear your catheter and your drainage bag Supplies needed Adhesive tape or a leg strap. Alcohol wipe or soap and water (if you use tape). A clean towel (if you use tape). Overnight drainage bag. Smaller drainage bag (leg bag). Wearing your catheter and bag Use adhesive tape or a leg  strap to attach your catheter to your leg. Make sure the catheter is not pulled tight. If a leg strap gets wet, replace it with a dry one. If you use adhesive tape: Use an alcohol wipe or soap and water to wash off any stickiness on your skin where you had tape before. Use a clean towel to pat-dry the area. Apply the new tape. You should have received a large overnight drainage bag and a smaller leg bag that fits underneath clothing. You may wear the overnight bag at any time, but you should not wear the leg bag at night. Make sure the overnight drainage bag is always lower than the level of your bladder, but do not let it touch the floor. Before you go to sleep, hang the bag inside a wastebasket that is covered by a clean plastic bag. Secure the leg bag according to manufacturer's instructions. This may be above or below the knee, depending on the length of the tubing. Make sure that: The leg bag is below the bladder. The tubing does not have loops or too much tension. How to care for the skin around the catheter Supplies needed A clean washcloth. Water and mild soap. A clean towel. Caring for your skin and catheter     Every day, use a clean washcloth and soapy water to clean the skin around your catheter. Wash your hands with soap and water. Wet a washcloth in warm water and mild soap. Clean the skin around your urethra. If you are male: Use one hand to gently spread the folds of skin around your vagina (labia). With the washcloth in your other hand, wipe the inner side of your labia on each side. Do this in a front-to-back direction. If you are male: Use one hand to pull back any skin that covers the end of your penis (foreskin). With the washcloth in your other hand, wipe your penis in small circles. Start wiping at the tip of your penis, then move outward from the catheter. Move the foreskin back in place, if needed. With your free hand, hold the catheter close to where it  enters your body. Keep holding the catheter during cleaning so it does not get pulled out. Use your other hand to clean the catheter with the washcloth. Only wipe downward on the catheter, toward the bag. Do not wipe upward toward your body, because that may  push bacteria into your urethra and cause infection. Use a clean towel to pat-dry the catheter and the skin around it. Make sure to wipe off all soap. Wash your hands with soap and water. Shower every day. Do not take baths. Do not use cream, ointment, or lotion on the area where the catheter enters your body, unless your health care provider tells you to do that. Do not use powders, sprays, or lotions on your genital area. Check your skin around the catheter every day for signs of infection. Check for: Redness, swelling, or pain. Fluid or blood. Warmth. Pus or a bad smell. How to empty the drainage bag Supplies needed Rubbing alcohol. Gauze pad or cotton ball. Adhesive tape or a leg strap. Emptying the bag Empty your drainage bag (your overnight drainage bag or your leg bag) when it is ?- full, or at least 2-3 times a day. Clean the drainage bag according to the manufacturer's instructions or as told by your health care provider. Wash your hands with soap and water. Detach the drainage bag from your leg. Hold the drainage bag over the toilet or a clean container. Make sure the drainage bag is lower than your hips and bladder. This stops urine from going back into the tubing and into your bladder. Open the pour spout at the bottom of the bag. Empty the urine into the toilet or container. Do not let the pour spout touch any surface. This precaution is important to prevent bacteria from getting in the bag and causing infection. Apply rubbing alcohol to a gauze pad or cotton ball. Use the gauze pad or cotton ball to clean the pour spout. Close the pour spout. Attach the bag to your leg with adhesive tape or a leg strap. Wash your  hands with soap and water. How to change the drainage bag Supplies needed: Alcohol wipes. A clean drainage bag. Adhesive tape or a leg strap. Changing the bag Replace your drainage bag with a clean bag if it leaks, starts to smell bad, or looks dirty. Wash your hands with soap and water. Detach the dirty drainage bag from your leg. Pinch the catheter with your fingers so that urine does not spill out. Disconnect the catheter tube from the drainage tube at the connection valve. Do not let the tubes touch any surface. Clean the end of the catheter tube with an alcohol wipe. Use a different alcohol wipe to clean the end of the drainage tube. Connect the catheter tube to the drainage tube of the clean bag. Attach the clean bag to your leg with adhesive tape or a leg strap. Avoid attaching the new bag too tightly. Wash your hands with soap and water. General instructions  Never pull on your catheter or try to remove it. Pulling can damage your internal tissues. Always wash your hands before and after you handle your catheter or drainage bag. Use a mild, fragrance-free soap. If soap and water are not available, use hand sanitizer. Always make sure there are no twists, bends, or kinks in the catheter tube. Always make sure there are no leaks in the catheter or drainage bag. Drink enough fluid to keep your urine pale yellow. Do not take baths, swim, or use a hot tub. If you are male, wipe from front to back after having a bowel movement. Contact a health care provider if: Your catheter gets clogged. Your catheter starts to leak. You have signs of infection at the catheter site, such as: Redness, swelling, or  pain where the catheter enters your body. Fluid, blood, pus, or a bad smell coming from the area where the catheter enters your body. The area where the catheter enters your body feels warm to the touch. You have signs of a urinary tract infection, such as: Fever or chills. Urine  smells unusually bad. Cloudy urine. Pain in your abdomen, legs, lower back, or bladder. Nausea or vomiting. Get help right away if: You see blood in the catheter. Your urine is pink or red. Your bladder feels full. Your urine is not draining into the bag. Your catheter gets pulled out. Summary An indwelling urinary catheter is a thin, flexible tube that is placed into the bladder to help drain urine out of the body. The catheter is inserted into the part of the body that drains urine from the bladder (urethra). Take good care of your catheter to keep it working properly and help prevent problems from developing. Always wash your hands before and after you handle your catheter or drainage bag. Never pull on your catheter or try to remove it. This information is not intended to replace advice given to you by your health care provider. Make sure you discuss any questions you have with your health care provider. Document Revised: 05/11/2021 Document Reviewed: 05/11/2021 [employer] Patient Education  2023 [employer].

## 2022-08-01 ENCOUNTER — Encounter: Payer: Self-pay | Admitting: Urology

## 2022-08-01 NOTE — Progress Notes (Signed)
Perioperative Services  Pre-Admission/Anesthesia Testing Clinical Review  Date: 08/02/22  Patient Demographics:  Name: Randy Shaffer DOB:   [birth year] MRN:   [id number]  Planned Surgical Procedure(s):    Case: [id number] Date/Time: 08/03/22 0809   Procedure: HOLEP-LASER ENUCLEATION OF THE PROSTATE WITH MORCELLATION   Anesthesia type: General   Pre-op diagnosis: Benign Prostatic Hyperplasia with Outlet Obstruction   Location: [facility] OR ROOM 10 / [place] ORS FOR ANESTHESIA GROUP   Surgeons: Billey Co, MD   NOTE: Available PAT nursing documentation and vital signs have been reviewed. Clinical nursing staff has updated patient's PMH/PSHx, current medication list, and drug allergies/intolerances to ensure comprehensive history available to assist in medical decision making as it pertains to the aforementioned surgical procedure and anticipated anesthetic course. Extensive review of available clinical information performed. [place] PMH and PSHx updated with any diagnoses/procedures that  may have been inadvertently omitted during his intake with the pre-admission testing department's nursing staff.  Clinical Discussion:  Randy Shaffer is a 71 y.o. male who is submitted for pre-surgical anesthesia review and clearance prior to him undergoing the above procedure. Patient has never been a smoker. Pertinent PMH includes: CAD, angina, aortic atherosclerosis, carotid artery disease, cardiac murmur, HTN, HLD, T2DM, OSAH (unable to tolerate nocturnal PAP therapy), DOE, BPH, OA, insomnia, depression.  Patient is followed by cardiology Clayborn Bigness, MD). He was last seen in the cardiology clinic on 06/07/2022; notes reviewed.  At the time of his clinic visit, patient doing well overall.  He reported feeling "better" after recent carotid stent placement.  Patient with some transient vertiginous symptoms, however he no significant improvement.  Patient continues to have episodes of mild shortness of  breath, however he reports he is to be stable and at baseline.  Patient denies any episodes of chest pain, PND, orthopnea, palpitations, significant peripheral edema, or presyncope/syncope.  Patient with a past medical history significant for cardiovascular diagnoses.  Myocardial perfusion imaging study performed on 11/20/2016 revealed a normal left ventricular systolic function with EF 58%.  There was normal myocardial thickening and wall motion.  No artifact is noted.  Left ventricular cavity size normal.  There was no evidence of stress-induced myocardial ischemia or arrhythmia; no scintigraphic evidence of scar. Study determined to be normal and low risk.  Diagnostic RIGHT/LEFT heart catheterization was performed on 12/13/2021 revealing a normal left trickle systolic function with an EF of 55 to 65%.  There was multivessel CAD; 25% distal LM to ostial LAD, 25% proximal LAD, and 20% mid to distal LAD.  Given the nonobstructive nature of the patient's coronary artery disease, intervention was deferred opting for medical management.  Hemodynamics: Mean PA = 20 mmHg, mean PCWP = 10 mmHg, Ao saturation = 96%, PA saturation = 71%, and cardiac output 5.7 L/min.  CTA of the neck performed on 03/02/2022 revealed a 70% stenosis of the LICA.  Patient subsequently underwent PTA with POBA on 04/19/2022 for an 80% stenosis in the LICA. 9 mm (proximal), 7 mm (distal), 4 cm long exact stent was placed resulting in a 10 to 15% residual stenosis.  Follow-up carotid Doppler performed on 05/22/2022 revealing a widely patent stent and a 1-39% in the BILATERAL internal carotid arteries.  Given history of cardiovascular disease, patient remains on daily DAPT therapy (ASA + clopidogrel).  He is reportedly compliant with therapy with no evidence or reports of GI bleeding.  Blood pressure well controlled at 112/62 without the use of pharmacological interventions.  Patient is on atorvastatin for  his HLD diagnosis and further ASCVD  prevention.  He is not diabetic.  Patient does have an OSAH diagnosis, however he is unable to tolerate nocturnal PAP therapy.  Patient making efforts to maintain an active lifestyle and has a formal exercise regimen. Functional capacity, as defined by DASI, is documented as being >/= 4 METS.  No changes were made to his medication regimen.  Patient to follow-up with outpatient cardiology in 4 months or sooner if needed.  Randy Shaffer is scheduled for a HOLEP-LASER ENUCLEATION OF THE PROSTATE WITH MORCELLATION on 08/03/2022 with Dr. Nickolas Madrid, MD.  Given patient's past medical history significant for cardiovascular diagnoses, presurgical cardiac clearance was sought by the PAT team. Per cardiology, "this patient is optimized for surgery and may proceed with the planned procedural course with a MODERATE risk of significant perioperative cardiovascular complications".  Again, patient is on daily DAPT therapy.  He has been instructed on recommendations for holding both his ASA and clopidogrel for 5 days prior to his procedure with plans to restart as soon as postoperative bleeding risk felt to be minimized by his primary attending surgeon.  Patient is aware that his last dose of these medications should be on 07/28/2022.   Patient reports previous perioperative complications with anesthesia in the past. Patient has a PMH (+) for PONV. Symptoms and history of PONV will be discussed with patient by anesthesia team on the day of her procedure. Interventions will be ordered as deemed necessary based on patient's individual care needs as determined by anesthesiologist. In review of the available records, it is noted that patient underwent a MAC anesthetic course at [facility] (ASA II) in 04/2015 without documented complications.      07/27/2022    2:03 PM 05/22/2022   10:14 AM 05/22/2022   10:13 AM  Vitals with BMI  Height _0   _1   Weight 221 lbs  227 lbs  BMI 00.37  04.88   Systolic  891 694  Diastolic  66 79  Pulse   59    Providers/Specialists:   NOTE: Primary physician provider listed below. Patient may have been seen by APP or partner within same practice.   PROVIDER ROLE / SPECIALTY LAST Ranae Pila, MD Urology (Surgeon) 04/12/2022  [place] Pink, MD Primary Care Provider 06/26/2022  Katrine Coho, MD Cardiology 06/07/2022  Leotis Pain, MD Vascular Surgery 05/22/2022   Allergies:  Thiopental sodium [thiopental]  Current Home Medications:   No current facility-administered medications for this encounter.    aspirin EC 81 MG tablet   atorvastatin (LIPITOR) 40 MG tablet   clopidogrel (PLAVIX) 75 MG tablet   FLUoxetine (PROZAC) 20 MG capsule   oxyCODONE-acetaminophen (PERCOCET/ROXICET) 5-325 MG tablet   tamsulosin (FLOMAX) 0.4 MG CAPS capsule   traZODone (DESYREL) 100 MG tablet   History:   Past Medical History:  Diagnosis Date   Anginal pain (HCC)    Aortic atherosclerosis (HCC)    BPH (benign prostatic hyperplasia)    CAD (coronary artery disease) 12/13/2021   a.) R/LHC 12/13/2021: EF 55-65%, 25% dLM-oLAD, 25% pLAD, 50% m-dLAD; mPA 20, mPCWP 10, Ao sat 96, PA sat 71, CO 5.7.   Cardiac murmur    Carotid artery disease ([place])    a.) CTA neck 50/38/8828: 00% LICA; b.) PTA 34/91/7915: 05% LICA --> POBA performed  followed by placement of a 9 mm prox, 31m distal, 4 cm long exact stent placed; 10-15% residual stenosis; c.) carotid doppler 05/22/2022: 1-39% BICA  Complication of anesthesia    a.) PONV   Depression    Diet-controlled type 2 diabetes mellitus ([place])    Diverticulosis 09/05/2011   DOE (dyspnea on exertion)    History of bilateral cataract extraction    HLD (hyperlipidemia)    Hypertension    Insomnia    a.) takes trazodone PRN   Long term current use of antithrombotics/antiplatelets    a.) on daily DAPT (ASA + clopidogrel)   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    OA (osteoarthritis) of knee    PONV (postoperative  nausea and vomiting)    SEVERE   Sleep apnea    a.) unable to tolerate nocturnal PAP therapy   Past Surgical History:  Procedure Laterality Date   AMPUTATION FINGER / THUMB Right    THUMB PLUS 3 RECONSTRUCTIVE SURGERIES   CAROTID PTA/STENT INTERVENTION Left 04/16/2022   Procedure: CAROTID PTA/STENT INTERVENTION;  Surgeon: Algernon Huxley, MD;  Location: [place] [facility];  Service: Cardiovascular;  Laterality: Left;   HEAD LACERATION REPAIR     HERNIA REPAIR  2000   ONE AS INFANT   HIP NERVE RELEASE     KNEE ARTHROSCOPY Right 2003   KNEE ARTHROSCOPY Right 02/23/2015   Procedure: ARTHROSCOPY KNEE WITH DEBRIDEMENT PARTIAL MEDIAL MENISECTOMY;  Surgeon: Corky Mull, MD;  Location: [place];  Service: Orthopedics;  Laterality: Right;   PHOTOREFRACTIVE KERATOTOMY     RIGHT/LEFT HEART CATH AND CORONARY ANGIOGRAPHY N/A 12/13/2021   Procedure: RIGHT/LEFT HEART CATH AND CORONARY ANGIOGRAPHY;  Surgeon: Yolonda Kida, MD;  Location: [place] [facility];  Service: Cardiovascular;  Laterality: N/A;   Family History  Problem Relation Age of Onset   Heart disease Mother    Cancer Mother    Heart attack Father    Cancer Sister    Cancer Brother    Heart attack Maternal Uncle    Social History   Tobacco Use   Smoking status: Never  Vaping Use   Vaping Use: Never used  Substance Use Topics   Alcohol use: Yes    Comment: 2-3 BEER/MONTH   Drug use: No    Pertinent Clinical Results:  LABS: Labs reviewed: Acceptable for surgery.  Lab Results  Component Value Date   WBC 6.2 04/17/2022   HGB 14.3 04/17/2022   HCT 42.0 04/17/2022   MCV 85.4 04/17/2022   PLT 167 04/17/2022   Lab Results  Component Value Date   NA 137 04/17/2022   K 4.1 04/17/2022   CO2 25 04/17/2022   GLUCOSE 117 (H) 04/17/2022   BUN 13 04/17/2022   CREATININE 1.18 04/17/2022   CALCIUM 8.2 (L) 04/17/2022   GFRNONAA >60 04/17/2022    Component Date Value Ref Range Status   Urine Culture,  Comprehensive 07/20/2022 Final report   Final   Organism ID, Bacteria 07/20/2022 Comment   Final   No growth in 36 - 48 hours.   Specific Gravity, UA 07/20/2022 1.010  1.005 - 1.030 Final   pH, UA 07/20/2022 6.5  5.0 - 7.5 Final   Color, UA 07/20/2022 Yellow  Yellow Final   Appearance Ur 07/20/2022 Clear  Clear Final   Leukocytes,UA 07/20/2022 Negative  Negative Final   Protein,UA 07/20/2022 Negative  Negative/Trace Final   Glucose, UA 07/20/2022 Negative  Negative Final   Ketones, UA 07/20/2022 Negative  Negative Final   RBC, UA 07/20/2022 3+ (A)  Negative Final   Bilirubin, UA 07/20/2022 Negative  Negative Final   Urobilinogen, Ur 07/20/2022  0.2  0.2 - 1.0 mg/dL Final   Nitrite, UA 07/20/2022 Negative  Negative Final   Microscopic Examination 07/20/2022 See below:   Final   WBC, UA 07/20/2022 0-5  0 - 5 /hpf Final   RBC, Urine 07/20/2022 >30 (A)  0 - 2 /hpf Final   Epithelial Cells (non renal) 07/20/2022 0-10  0 - 10 /hpf Final   Bacteria, UA 07/20/2022 Few  None seen/Few Final    ECG: Date: 11/23/2021 Time ECG obtained: 1005 AM Rate: 56 bpm Rhythm: sinus bradycardia Axis (leads I and aVF): Normal Intervals: PR 150 ms. QRS 88 ms. QTc 422 ms. ST segment and T wave changes: No evidence of acute ST segment elevation or depression Comparison: Similar to previous tracing obtained on 11/08/2016 NOTE: Tracing obtained at [facility]; unable for review. Above based on cardiologist's interpretation.    IMAGING / PROCEDURES: VAS US CAROTID performed on 05/22/2022 Right Carotid: Velocities in the right ICA are consistent with a 1-39% stenosis. Mild homogeneous smooth plaque in the bulb/ICA with no significant stenosis seen.  Left Carotid: Velocities in the left ICA are consistent with a 1-39% stenosis. Widely patent new ICA stent with no evidence of re-stenosis.  Vertebrals:  Bilateral vertebral arteries demonstrate antegrade flow.  Subclavians: Normal flow hemodynamics were seen in  bilateral subclavian arteries.   CT ANGIO NECK W OR WO CONTRAST performed on 03/02/2022 Left > Right carotid atherosclerosis. Bulky soft plaque in the Left ICA bulb causes hemodynamically significant stenosis numerically estimated at 70%. No significant right Carotid or Vertebral artery stenosis. Soft plaque at the Right Subclavian Artery origin is Ulcerated but without significant stenosis. Aortic atherosclerosis  RIGHT/LEFT HEART CATHETERIZATION AND CORONARY ANGIOGRAPHY performed on 12/13/2021 Normal left ventricular systolic function with an EF of 55-65% LVEDP normal Nonobstructive CAD 25% distal LM to ostial LAD 25% proximal LAD 50% mid LAD to distal LAD Hemodynamics Mean PA pressure = 20 mmHg Mean PCWP = 10 mmHg Mean RA pressure = 8 mmHg Ao saturation = 96% PA saturation = 71% LVEDP 16 mmHg SVR = 1988 PVR = 298 CO = 5.7 L/min  MYOCARDIAL PERFUSION IMAGING STUDY (LEXISCAN) performed on 11/20/2016 Normal left ventricular systolic function with a normal LVEF of 58% Normal myocardial thickening and wall motion Left ventricular cavity size normal SPECT images demonstrate homogenous tracer distribution throughout the myocardium No evidence of stress-induced myocardial ischemia or arrhythmia Normal low risk study  Impression and Plan:  Randy Shaffer has been referred for pre-anesthesia review and clearance prior to him undergoing the planned anesthetic and procedural courses. Available labs, pertinent testing, and imaging results were personally reviewed by me. This patient has been appropriately cleared by cardiology with an overall MODERATE.  The EKG and treatment for surgery is not an clearance  Risk of significant perioperative cardiovascular complications.  Based on clinical review performed today (08/02/22), barring any significant acute changes in the patient's overall condition, it is anticipated that he will be able to proceed with the planned surgical intervention. Any  acute changes in clinical condition may necessitate his procedure being postponed and/or cancelled. Patient will meet with anesthesia team (MD and/or CRNA) on the day of his procedure for preoperative evaluation/assessment. Questions regarding anesthetic course will be fielded at that time.   Pre-surgical instructions were reviewed with the patient during his PAT appointment and questions were fielded by PAT clinical staff. Patient was advised that if any questions or concerns arise prior to his procedure then he should return a call to  PAT and/or his surgeon's office to discuss.  Honor Loh, MSN, APRN, FNP-C, CEN [facility]  Peri-operative Services Nurse Practitioner Phone: [phone] Fax: [phone] 08/02/22 12:22 PM  NOTE: This note has been prepared using Dragon dictation software. Despite my best ability to proofread, there is always the potential that unintentional transcriptional errors may still occur from this process.

## 2022-08-02 MED ORDER — ORAL CARE MOUTH RINSE: 15.0000 mL | Freq: Once | OROMUCOSAL | Status: AC

## 2022-08-02 MED ORDER — FAMOTIDINE 20 MG PO TABS: 20.0000 mg | ORAL_TABLET | Freq: Once | ORAL | Status: AC

## 2022-08-02 MED ORDER — CHLORHEXIDINE GLUCONATE 0.12 % MT SOLN: 15.0000 mL | Freq: Once | OROMUCOSAL | Status: AC

## 2022-08-02 MED ORDER — CEFAZOLIN SODIUM-DEXTROSE 2-4 GM/100ML-% IV SOLN
2.0000 g | INTRAVENOUS | Status: AC
  Administered 2022-08-03: 2 g via INTRAVENOUS

## 2022-08-02 MED ORDER — LACTATED RINGERS IV SOLN: INTRAVENOUS | Status: DC

## 2022-08-03 ENCOUNTER — Ambulatory Visit: Payer: Medicare Other | Admitting: Urgent Care

## 2022-08-03 ENCOUNTER — Encounter
Admission: RE | Disposition: A | Discharge status: Home / Self Care | Payer: Self-pay | Source: Ambulatory Visit | Attending: Urology

## 2022-08-03 ENCOUNTER — Ambulatory Visit
Admission: RE | Admit: 2022-08-03 | Discharge: 2022-08-03 | Disposition: A | Discharge status: Home / Self Care | Payer: Medicare Other | Source: Ambulatory Visit | Attending: Urology | Admitting: Urology

## 2022-08-03 ENCOUNTER — Other Ambulatory Visit: Payer: Self-pay

## 2022-08-03 ENCOUNTER — Encounter: Payer: Self-pay | Admitting: Urology

## 2022-08-03 DIAGNOSIS — I10 Essential (primary) hypertension: Secondary | ICD-10-CM | POA: Insufficient documentation

## 2022-08-03 DIAGNOSIS — R3914 Feeling of incomplete bladder emptying: Secondary | ICD-10-CM | POA: Insufficient documentation

## 2022-08-03 DIAGNOSIS — Z7902 Long term (current) use of antithrombotics/antiplatelets: Secondary | ICD-10-CM | POA: Diagnosis not present

## 2022-08-03 DIAGNOSIS — E1151 Type 2 diabetes mellitus with diabetic peripheral angiopathy without gangrene: Secondary | ICD-10-CM | POA: Diagnosis not present

## 2022-08-03 DIAGNOSIS — N323 Diverticulum of bladder: Secondary | ICD-10-CM | POA: Diagnosis not present

## 2022-08-03 DIAGNOSIS — Z95828 Presence of other vascular implants and grafts: Secondary | ICD-10-CM | POA: Insufficient documentation

## 2022-08-03 DIAGNOSIS — N401 Enlarged prostate with lower urinary tract symptoms: Secondary | ICD-10-CM | POA: Diagnosis present

## 2022-08-03 DIAGNOSIS — E782 Mixed hyperlipidemia: Secondary | ICD-10-CM | POA: Diagnosis not present

## 2022-08-03 DIAGNOSIS — F32A Depression, unspecified: Secondary | ICD-10-CM | POA: Insufficient documentation

## 2022-08-03 DIAGNOSIS — I700 Atherosclerosis of aorta: Secondary | ICD-10-CM | POA: Insufficient documentation

## 2022-08-03 DIAGNOSIS — G4733 Obstructive sleep apnea (adult) (pediatric): Secondary | ICD-10-CM | POA: Diagnosis not present

## 2022-08-03 DIAGNOSIS — I25119 Atherosclerotic heart disease of native coronary artery with unspecified angina pectoris: Secondary | ICD-10-CM | POA: Diagnosis not present

## 2022-08-03 DIAGNOSIS — N138 Other obstructive and reflux uropathy: Secondary | ICD-10-CM | POA: Diagnosis not present

## 2022-08-03 DIAGNOSIS — I6529 Occlusion and stenosis of unspecified carotid artery: Secondary | ICD-10-CM | POA: Diagnosis not present

## 2022-08-03 DIAGNOSIS — Z794 Long term (current) use of insulin: Secondary | ICD-10-CM | POA: Insufficient documentation

## 2022-08-03 DIAGNOSIS — R3912 Poor urinary stream: Secondary | ICD-10-CM | POA: Diagnosis not present

## 2022-08-03 DIAGNOSIS — Z7982 Long term (current) use of aspirin: Secondary | ICD-10-CM | POA: Diagnosis not present

## 2022-08-03 HISTORY — DX: Cataract extraction status, left eye: Z98.42

## 2022-08-03 HISTORY — DX: Other forms of dyspnea: R06.09

## 2022-08-03 HISTORY — DX: Hyperlipidemia, unspecified: E78.5

## 2022-08-03 HISTORY — PX: HOLEP-LASER ENUCLEATION OF THE PROSTATE WITH MORCELLATION: SHX6641

## 2022-08-03 HISTORY — DX: Disorder of arteries and arterioles, unspecified: I77.9

## 2022-08-03 HISTORY — DX: Osteoarthritis of knee, unspecified: M17.9

## 2022-08-03 HISTORY — DX: Angina pectoris, unspecified: I20.9

## 2022-08-03 HISTORY — DX: Unspecified hearing loss, right ear: H91.91

## 2022-08-03 HISTORY — DX: Long term (current) use of antithrombotics/antiplatelets: Z79.02

## 2022-08-03 HISTORY — DX: Cardiac murmur, unspecified: R01.1

## 2022-08-03 HISTORY — DX: Type 2 diabetes mellitus without complications: E11.9

## 2022-08-03 HISTORY — DX: Cataract extraction status, right eye: Z98.41

## 2022-08-03 HISTORY — DX: Benign prostatic hyperplasia without lower urinary tract symptoms: N40.0

## 2022-08-03 HISTORY — DX: Atherosclerosis of aorta: I70.0

## 2022-08-03 LAB — GLUCOSE, CAPILLARY: Glucose-Capillary: 135 mg/dL — ABNORMAL HIGH (ref 70–99)

## 2022-08-03 SURGERY — ENUCLEATION, PROSTATE, USING LASER, WITH MORCELLATION
Anesthesia: General | Site: Prostate

## 2022-08-03 MED ORDER — FENTANYL CITRATE (PF) 100 MCG/2ML IJ SOLN
INTRAMUSCULAR | Status: DC | PRN
  Administered 2022-08-03: 50 ug via INTRAVENOUS
  Administered 2022-08-03 (×2): 25 ug via INTRAVENOUS

## 2022-08-03 MED ORDER — CEFAZOLIN SODIUM-DEXTROSE 2-4 GM/100ML-% IV SOLN
INTRAVENOUS | Status: AC
  Filled 2022-08-03: qty 100

## 2022-08-03 MED ORDER — PHENYLEPHRINE HCL (PRESSORS) 10 MG/ML IV SOLN
INTRAVENOUS | Status: DC | PRN
  Administered 2022-08-03 (×2): 80 ug via INTRAVENOUS

## 2022-08-03 MED ORDER — ACETAMINOPHEN 10 MG/ML IV SOLN: 1000.0000 mg | Freq: Once | INTRAVENOUS | Status: DC | PRN

## 2022-08-03 MED ORDER — LIDOCAINE HCL (CARDIAC) PF 100 MG/5ML IV SOSY
PREFILLED_SYRINGE | INTRAVENOUS | Status: DC | PRN
  Administered 2022-08-03: 80 mg via INTRAVENOUS

## 2022-08-03 MED ORDER — MIDAZOLAM HCL 2 MG/2ML IJ SOLN
INTRAMUSCULAR | Status: AC
  Filled 2022-08-03: qty 2

## 2022-08-03 MED ORDER — MIDAZOLAM HCL 2 MG/2ML IJ SOLN
INTRAMUSCULAR | Status: DC | PRN
  Administered 2022-08-03: 2 mg via INTRAVENOUS

## 2022-08-03 MED ORDER — FENTANYL CITRATE (PF) 100 MCG/2ML IJ SOLN: 25.0000 ug | INTRAMUSCULAR | Status: DC | PRN

## 2022-08-03 MED ORDER — GLYCOPYRROLATE 0.2 MG/ML IJ SOLN
INTRAMUSCULAR | Status: DC | PRN
  Administered 2022-08-03: .2 mg via INTRAVENOUS

## 2022-08-03 MED ORDER — OXYCODONE HCL 5 MG/5ML PO SOLN: 5.0000 mg | Freq: Once | ORAL | Status: DC | PRN

## 2022-08-03 MED ORDER — OXYCODONE HCL 5 MG PO TABS: 5.0000 mg | ORAL_TABLET | Freq: Once | ORAL | Status: DC | PRN

## 2022-08-03 MED ORDER — STERILE WATER FOR IRRIGATION IR SOLN
Status: DC | PRN
  Administered 2022-08-03: 1000 mL

## 2022-08-03 MED ORDER — DEXAMETHASONE SODIUM PHOSPHATE 10 MG/ML IJ SOLN
INTRAMUSCULAR | Status: DC | PRN
  Administered 2022-08-03: 10 mg via INTRAVENOUS

## 2022-08-03 MED ORDER — SODIUM CHLORIDE 0.9 % IR SOLN
Status: DC | PRN
  Administered 2022-08-03: 30000 mL via INTRAVESICAL

## 2022-08-03 MED ORDER — ONDANSETRON HCL 4 MG/2ML IJ SOLN: 4.0000 mg | Freq: Once | INTRAMUSCULAR | Status: DC | PRN

## 2022-08-03 MED ORDER — LACTATED RINGERS IV SOLN: INTRAVENOUS | Status: DC

## 2022-08-03 MED ORDER — OXYCODONE-ACETAMINOPHEN 5-325 MG PO TABS: 1.0000 | ORAL_TABLET | Freq: Four times a day (QID) | ORAL | 0 refills | Status: AC | PRN

## 2022-08-03 MED ORDER — ONDANSETRON HCL 4 MG/2ML IJ SOLN
INTRAMUSCULAR | Status: DC | PRN
  Administered 2022-08-03: 4 mg via INTRAVENOUS

## 2022-08-03 MED ORDER — FAMOTIDINE 20 MG PO TABS
ORAL_TABLET | ORAL | Status: AC
  Administered 2022-08-03: 20 mg via ORAL
  Filled 2022-08-03: qty 1

## 2022-08-03 MED ORDER — CHLORHEXIDINE GLUCONATE 0.12 % MT SOLN
OROMUCOSAL | Status: AC
  Administered 2022-08-03: 15 mL via OROMUCOSAL
  Filled 2022-08-03: qty 15

## 2022-08-03 MED ORDER — PROPOFOL 10 MG/ML IV BOLUS
INTRAVENOUS | Status: DC | PRN
  Administered 2022-08-03: 180 mg via INTRAVENOUS

## 2022-08-03 MED ORDER — SUGAMMADEX SODIUM 500 MG/5ML IV SOLN
INTRAVENOUS | Status: DC | PRN
  Administered 2022-08-03: 402.8 mg via INTRAVENOUS

## 2022-08-03 MED ORDER — EPHEDRINE SULFATE (PRESSORS) 50 MG/ML IJ SOLN
INTRAMUSCULAR | Status: DC | PRN
  Administered 2022-08-03: 10 mg via INTRAVENOUS

## 2022-08-03 MED ORDER — ROCURONIUM BROMIDE 100 MG/10ML IV SOLN
INTRAVENOUS | Status: DC | PRN
  Administered 2022-08-03: 50 mg via INTRAVENOUS
  Administered 2022-08-03: 30 mg via INTRAVENOUS

## 2022-08-03 MED ORDER — FENTANYL CITRATE (PF) 100 MCG/2ML IJ SOLN
INTRAMUSCULAR | Status: AC
  Filled 2022-08-03: qty 2

## 2022-08-03 SURGICAL SUPPLY — 36 items
ADAPTER IRRIG TUBE 2 SPIKE SOL (ADAPTER) ×2 IMPLANT
ADPR TBG 2 SPK PMP STRL ASCP (ADAPTER) ×2
BAG DRN LRG CPC RND TRDRP CNTR (MISCELLANEOUS) ×1
BAG URO DRAIN 4000ML (MISCELLANEOUS) ×1 IMPLANT
CATH FOLEY 3WAY 30CC 24FR (CATHETERS) ×1
CATH URETL OPEN END 4X70 (CATHETERS) ×1 IMPLANT
CATH URTH STD 24FR FL 3W 2 (CATHETERS) ×1 IMPLANT
CONTAINER COLLECT MORCELLATR (MISCELLANEOUS) ×1 IMPLANT
DRAPE UTILITY 15X26 TOWEL STRL (DRAPES) IMPLANT
ELECT BIVAP BIPO 22/24 DONUT (ELECTROSURGICAL)
ELECTRD BIVAP BIPO 22/24 DONUT (ELECTROSURGICAL) IMPLANT
FIBER LASER MOSES 550 DFL (Laser) ×1 IMPLANT
FILTER OVERFLOW MORCELLATOR (FILTER) ×1 IMPLANT
GLOVE SURG UNDER POLY LF SZ7.5 (GLOVE) ×1 IMPLANT
GOWN STRL REUS W/ TWL LRG LVL3 (GOWN DISPOSABLE) ×1 IMPLANT
GOWN STRL REUS W/ TWL XL LVL3 (GOWN DISPOSABLE) ×1 IMPLANT
GOWN STRL REUS W/TWL LRG LVL3 (GOWN DISPOSABLE) ×1
GOWN STRL REUS W/TWL XL LVL3 (GOWN DISPOSABLE) ×1
HOLDER FOLEY CATH W/STRAP (MISCELLANEOUS) ×1 IMPLANT
IV NS IRRIG 3000ML ARTHROMATIC (IV SOLUTION) ×5 IMPLANT
KIT TURNOVER CYSTO (KITS) ×1 IMPLANT
MANIFOLD NEPTUNE II (INSTRUMENTS) IMPLANT
MBRN O SEALING YLW 17 FOR INST (MISCELLANEOUS) ×1
MEMBRANE SLNG YLW 17 FOR INST (MISCELLANEOUS) ×1 IMPLANT
MORCELLATOR COLLECT CONTAINER (MISCELLANEOUS) ×1
MORCELLATOR OVERFLOW FILTER (FILTER) ×1
MORCELLATOR ROTATION 4.75 335 (MISCELLANEOUS) ×1 IMPLANT
PACK CYSTO AR (MISCELLANEOUS) ×1 IMPLANT
SET CYSTO W/LG BORE CLAMP LF (SET/KITS/TRAYS/PACK) ×1 IMPLANT
SET IRRIG Y TYPE TUR BLADDER L (SET/KITS/TRAYS/PACK) ×1 IMPLANT
SLEEVE PROTECTION STRL DISP (MISCELLANEOUS) ×2 IMPLANT
SURGILUBE 2OZ TUBE FLIPTOP (MISCELLANEOUS) ×1 IMPLANT
SYR TOOMEY IRRIG 70ML (MISCELLANEOUS) ×1
SYRINGE TOOMEY IRRIG 70ML (MISCELLANEOUS) ×1 IMPLANT
TUBE PUMP MORCELLATOR PIRANHA (TUBING) ×1 IMPLANT
WATER STERILE IRR 1000ML POUR (IV SOLUTION) ×1 IMPLANT

## 2022-08-03 NOTE — Anesthesia Preprocedure Evaluation (Addendum)
Anesthesia Evaluation  Patient identified by MRN, date of birth, ID band Patient awake    Reviewed: Allergy & Precautions, NPO status , Patient's Chart, lab work & pertinent test results  History of Anesthesia Complications (+) PONV and history of anesthetic complications  Airway Mallampati: IV   Neck ROM: Full    Dental no notable dental hx.    Pulmonary sleep apnea    Pulmonary exam normal breath sounds clear to auscultation       Cardiovascular hypertension, + CAD and + Peripheral Vascular Disease (carotid stenosis s/p stent on Plavix)  Normal cardiovascular exam Rhythm:Regular Rate:Normal  ECG 11/23/21:  Sinus bradycardia  Minimal voltage criteria for LVH, may be normal variant  Borderline ECG  When compared with ECG of 08-Nov-2016 09:56,  No significant change was found    Cardiac cath 12/13/21:  Conclusion Right heart cath Normal pulmonary pressures with a mean wedge pressure of 10 Mean PA pressures of 20 sats of 71%  Left heart cath Left ventriculogram was normal EF around 60% Coronaries Left main free of disease LAD medium 25% ostial 25% proximal diffuse 50 to 75% mid to distal LAD small Circumflex medium in size relatively free of disease RCA was large with minor irregularities Intervention deferred not indicated No complications    Myocardial perfusion 11/20/16: Normal myocardial perfusion scan no evidence of stress-induced myocardial ischemia ejection fraction of 58% conclusion negative scan   Neuro/Psych HOH    GI/Hepatic negative GI ROS,,,  Endo/Other  diabetes, Type 2, Insulin Dependent    Renal/GU      Musculoskeletal  (+) Arthritis ,    Abdominal   Peds  Hematology negative hematology ROS (+)   Anesthesia Other Findings Reviewed and agree with Bayard Males pre-anesthesia clinical review note.    Cardiology note 06/07/22:  71 y.o. male with  1. Dizziness  2. Internal carotid artery  stent present  3. Coronary artery disease, non-occlusive  4. Primary hypertension  5. Mixed hyperlipidemia  6. Non-insulin dependent type 2 diabetes mellitus (CMS-HCC)  7. OSA (obstructive sleep apnea)    #Dizziness, acute, likely secondary to #carotid artery disease, bilateral s/p left ICA stent placement (04/2022) and side effects of antihypertensive medications. The patient's dizziness has significantly improved post stent placement and upon stopping lisinopril therapy. Symptoms have not completely resolved but appears to be progressively improving each day. -Carotid stenosis management per vascular surgery. Patient is on aspirin,Plavix and atorvastatin. -Agree with stopping lisinopril therapy. - -Recommend changing positions slowly, stay adequately hydrated with water and electrolytes, eating at least 3 healthy meals daily with snacks in between to avoid the risk of hypoglycemia and following fall precautions.  #CAD, nonocclusive, patient is without chest pain, chronic, stable #Hypertension, chronic, well controlled by diet #Hyperlipidemia, chronic, fairly controlled #Diabetes mellitus type 2 non-insulin-dependent, chronic, reasonably controlled The patient is not having any chest pain or anginal-like symptoms at this time. His left heart catheterization from 11/2021 revealed 25% ostial and proximal LAD stenosis with 50 to 75% mid to distal LAD that was very small, a left circumflex that was free of disease and a RCA with minor irregularities; LV function was estimated to be at 60% and was normal; right heart cath revealed normal near pulmonary pressures with a mean wedge of TM. -Continue secondary prevention measures with aspirin and atorvastatin. -Patient was unable to tolerate ACEi as above. -We will consider adding low-dose beta-blocker in the future; however, this is deferred at this time due to patient's ongoing dizziness. - -Counseling  discussion included heart healthy lifestyle  inclusive of avoidance of tobacco products, weight control, blood pressure and blood sugar control, and regular exercise regimen to include daily aerobic exercise for least 150 minutes per week, as tolerated, at a moderate intensity. -Diabetes management per PCP.   #OSA Chronic, poorly controlled. Patient unable to tolerate CPAP machine. -Recommend following up with pulmonary regarding other options for OSA management.   Return in about 4 months (around 10/07/2022). Discussed return precautions with patient for acute and chronic cardiovascular issues.    Reproductive/Obstetrics                             Anesthesia Physical Anesthesia Plan  ASA: 3  Anesthesia Plan: General   Post-op Pain Management:    Induction: Intravenous  PONV Risk Score and Plan: 3 and Ondansetron, Dexamethasone and Treatment may vary due to age or medical condition  Airway Management Planned: Oral ETT  Additional Equipment:   Intra-op Plan:   Post-operative Plan: Extubation in OR  Informed Consent: I have reviewed the patients History and Physical, chart, labs and discussed the procedure including the risks, benefits and alternatives for the proposed anesthesia with the patient or authorized representative who has indicated his/her understanding and acceptance.     Dental advisory given  Plan Discussed with: CRNA  Anesthesia Plan Comments: (Patient consented for risks of anesthesia including but not limited to:  - adverse reactions to medications - damage to eyes, teeth, lips or other oral mucosa - nerve damage due to positioning  - sore throat or hoarseness - damage to heart, brain, nerves, lungs, other parts of body or loss of life  Informed patient about role of CRNA in peri- and intra-operative care.  Patient voiced understanding.)        Anesthesia Quick Evaluation

## 2022-08-03 NOTE — Anesthesia Postprocedure Evaluation (Signed)
Anesthesia Post Note  Patient: Randy Shaffer  Procedure(s) Performed: HOLEP-LASER ENUCLEATION OF THE PROSTATE WITH MORCELLATION (Prostate)  Patient location during evaluation: PACU Anesthesia Type: General Level of consciousness: awake and alert, oriented and patient cooperative Pain management: pain level controlled Vital Signs Assessment: post-procedure vital signs reviewed and stable Respiratory status: spontaneous breathing, nonlabored ventilation and respiratory function stable Cardiovascular status: blood pressure returned to baseline and stable Postop Assessment: adequate PO intake Anesthetic complications: yes   Encounter Notable Events  Notable Event Outcome Phase Comment  Difficult to intubate - unexpected  Intraprocedure Filed from anesthesia note documentation.     Last Vitals:  Vitals:   08/03/22 0930 08/03/22 0950  BP: (!) 162/73 (!) 156/68  Pulse: (!) 55 (!) 53  Resp: (!) 9   Temp: (!) 36.1 C (!) 36.3 C  SpO2: 99% 100%    Last Pain:  Vitals:   08/03/22 0950  TempSrc: Temporal  PainSc: 0-No pain                 Reed Breech

## 2022-08-03 NOTE — Transfer of Care (Signed)
Immediate Anesthesia Transfer of Care Note  Patient: Randy Shaffer  Procedure(s) Performed: HOLEP-LASER ENUCLEATION OF THE PROSTATE WITH MORCELLATION (Prostate)  Patient Location: PACU  Anesthesia Type:General  Level of Consciousness: awake and sedated  Airway & Oxygen Therapy: Patient Spontanous Breathing and Patient connected to face mask oxygen  Post-op Assessment: Report given to RN and Post -op Vital signs reviewed and stable  Post vital signs: Reviewed and stable  Last Vitals:  Vitals Value Taken Time  BP 163/85 08/03/22 0857  Temp 35.9 C 08/03/22 0857  Pulse 59 08/03/22 0859  Resp 8 08/03/22 0859  SpO2 100 % 08/03/22 0859  Vitals shown include unvalidated device data.  Last Pain:  Vitals:   08/03/22 0857  TempSrc:   PainSc: 0-No pain         Complications:  Encounter Notable Events  Notable Event Outcome Phase Comment  Difficult to intubate - unexpected  Intraprocedure Filed from anesthesia note documentation.

## 2022-08-03 NOTE — Discharge Instructions (Signed)

## 2022-08-03 NOTE — H&P (Signed)
08/03/22 7:11 AM   Boneta Lucks [birth year] [id number]  CC: BPH  HPI: 71 year old male with bothersome urinary symptoms of weak stream, dribbling, sensation of incomplete emptying, and elevated PVRs greater than 400 mL.  Trialed Flomax with minimal to no improvement.  Cystoscopy and TRUS today show BPH as the etiology of his urinary symptoms and incomplete bladder emptying.  We reviewed outlet procedures ranging from UroLift, TURP, to HOLEP, and I recommended HOLEP with his enlarged gland and significant symptoms with incomplete bladder emptying to maximize symptom improvement.    PMH: Past Medical History:  Diagnosis Date   Anginal pain (HCC)    Aortic atherosclerosis (HCC)    BPH (benign prostatic hyperplasia)    CAD (coronary artery disease) 12/13/2021   a.) R/LHC 12/13/2021: EF 55-65%, 25% dLM-oLAD, 25% pLAD, 50% m-dLAD; mPA 20, mPCWP 10, Ao sat 96, PA sat 71, CO 5.7.   Cardiac murmur    Carotid artery disease (HCC)    a.) CTA neck 03/02/2022: 70% LICA; b.) PTA 04/19/2022: 80% LICA --> POBA performed  followed by placement of a 9 mm prox, 40mm distal, 4 cm long exact stent placed; 10-15% residual stenosis; c.) carotid doppler 05/22/2022: 1-39% BICA   Complication of anesthesia    a.) PONV   Depression    Diet-controlled type 2 diabetes mellitus (HCC)    Diverticulosis 09/05/2011   DOE (dyspnea on exertion)    History of bilateral cataract extraction    HLD (hyperlipidemia)    Hypertension    Insomnia    a.) takes trazodone PRN   Long term current use of antithrombotics/antiplatelets    a.) on daily DAPT (ASA + clopidogrel)   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    OA (osteoarthritis) of knee    PONV (postoperative nausea and vomiting)    SEVERE   Sleep apnea    a.) unable to tolerate nocturnal PAP therapy    Surgical History: Past Surgical History:  Procedure Laterality Date   AMPUTATION FINGER / THUMB Right    THUMB PLUS 3 RECONSTRUCTIVE SURGERIES   CAROTID PTA/STENT  INTERVENTION Left 04/16/2022   Procedure: CAROTID PTA/STENT INTERVENTION;  Surgeon: Annice Needy, MD;  Location: [facility];  Service: Cardiovascular;  Laterality: Left;   HEAD LACERATION REPAIR     HERNIA REPAIR  2000   ONE AS INFANT   HIP NERVE RELEASE     KNEE ARTHROSCOPY Right 2003   KNEE ARTHROSCOPY Right 02/23/2015   Procedure: ARTHROSCOPY KNEE WITH DEBRIDEMENT PARTIAL MEDIAL MENISECTOMY;  Surgeon: Christena Flake, MD;  Location: [facility];  Service: Orthopedics;  Laterality: Right;   PHOTOREFRACTIVE KERATOTOMY     RIGHT/LEFT HEART CATH AND CORONARY ANGIOGRAPHY N/A 12/13/2021   Procedure: RIGHT/LEFT HEART CATH AND CORONARY ANGIOGRAPHY;  Surgeon: Alwyn Pea, MD;  Location: [facility];  Service: Cardiovascular;  Laterality: N/A;     Family History: Family History  Problem Relation Age of Onset   Heart disease Mother    Cancer Mother    Heart attack Father    Cancer Sister    Cancer Brother    Heart attack Maternal Uncle     Social History:  reports that he has never smoked. He does not have any smokeless tobacco history on file. He reports current alcohol use. He reports that he does not use drugs.  Physical Exam:  Constitutional:  Alert and oriented, No acute distress. Cardiovascular: RRR Respiratory: CTA bilaterally   Laboratory Data: Urine culture 07/20/2022  no growth  Assessment & Plan:   71 year old male with obstructive urinary symptoms and incomplete bladder emptying with PVR greater than 400 mL, no improvement with medications, opted for HOLEP.  Prostate measures 54g.  PSA 3.2 in June 2023.  We discussed the risks and benefits of HoLEP at length.  The procedure requires general anesthesia and takes 1 to 2 hours, and a holmium laser is used to enucleate the prostate and push this tissue into the bladder.  A morcellator is then used to remove this tissue, which is sent for pathology.  The vast majority(>95%) of patients are able to  discharge the same day with a catheter in place for 2 to 3 days, and will follow-up in clinic for a voiding trial.  We specifically discussed the risks of bleeding, infection, retrograde ejaculation, temporary urgency and urge incontinence, very low risk of long-term incontinence, urethral stricture/bladder neck contracture, pathologic evaluation of prostate tissue and possible detection of prostate cancer or other malignancy, and possible need for additional procedures.  HOLEP today  Legrand Rams, MD 08/03/2022  [facility] [address] [place], [place] [address] [phone]

## 2022-08-03 NOTE — Anesthesia Procedure Notes (Signed)
Procedure Name: Intubation Date/Time: 08/03/2022 8:03 AM  Performed by: Nelda Marseille, CRNAPre-anesthesia Checklist: Patient identified, Patient being monitored, Timeout performed, Emergency Drugs available and Suction available Patient Re-evaluated:Patient Re-evaluated prior to induction Oxygen Delivery Method: Circle System Utilized Preoxygenation: Pre-oxygenation with 100% oxygen Induction Type: IV induction Ventilation: Mask ventilation without difficulty Laryngoscope Size: Mac, 3 and McGraph Grade View: Grade II Tube type: Oral Tube size: 7.5 mm Number of attempts: 1 Airway Equipment and Method: Stylet and Oral airway Placement Confirmation: ETT inserted through vocal cords under direct vision, positive ETCO2 and breath sounds checked- equal and bilateral Secured at: 21 cm Tube secured with: Tape Dental Injury: Teeth and Oropharynx as per pre-operative assessment  Difficulty Due To: Difficulty was unanticipated

## 2022-08-03 NOTE — Op Note (Signed)
Date of procedure: 08/03/22  Preoperative diagnosis:  BPH with obstruction  Postoperative diagnosis:  Same  Procedure: HoLEP (Holmium Laser Enucleation of the Prostate)  Surgeon: Legrand Rams, MD  Anesthesia: General  Complications: None  Intraoperative findings:  Large prostate with obstructing lateral lobes, small median lobe, posterior wall bladder diverticulum Uncomplicated HOLEP, ureteral orifices and verumontanum intact at conclusion of case, excellent hemostasis  EBL: Minimal  Specimens: Prostate chips  Enucleation time: 19 minutes  Morcellation time: 5 minutes  Intra-op weight: 35 g  Drains: 24 French three-way, 60 cc in balloon  Indication: Randy Shaffer is a 71 y.o. patient with BPH and obstructive urinary symptoms refractory to medical management with incomplete bladder emptying  and PVRs >417ml.  After reviewing the management options for treatment, they elected to proceed with the above surgical procedure(s). We have discussed the potential benefits and risks of the procedure, side effects of the proposed treatment, the likelihood of the patient achieving the goals of the procedure, and any potential problems that might occur during the procedure or recuperation.  We specifically discussed the risks of bleeding, infection, hematuria and clot retention, need for additional procedures, possible overnight hospital stay, temporary urgency and incontinence, rare long-term incontinence, and retrograde ejaculation.  Informed consent has been obtained.   Description of procedure:  The patient was taken to the operating room and general anesthesia was induced.  The patient was placed in the dorsal lithotomy position, prepped and draped in the usual sterile fashion, and preoperative antibiotics(Ancef) were administered.  SCDs were placed for DVT prophylaxis.  A preoperative time-out was performed.   Randy Shaffer sounds were used to gently dilated the urethra up to 85F. The  62 French continuous flow resectoscope was inserted into the urethra using the visual obturator  The prostate was large with obstructing lateral lobes and a small median lobe. The bladder was thoroughly inspected and notable for moderate trabeculations, widemouth posterior bladder diverticulum.  The ureteral orifices were located in orthotopic position.  The laser was set to 2 J and 60 Hz and was used to make a lambda incision just proximal to the verumontanum down to the level of the capsule. Early apical release was performed by making a circumferential mucosal incision proximal to the sphincter.  This incision was followed circumferentially into the bladder and the prostate was enucleated en bloc.  The capsule was examined and laser was used for meticulous hemostasis.    The 55 French resectoscope was then switched out for the 26 French nephroscope and the lobes were morcellated and the tissue sent to pathology.  A 24 French three-way catheter was inserted easily with the aid of a catheter guide, and 60 cc were placed in the balloon.  Urine was light pink.  The catheter irrigated easily with a Toomey syringe.  CBI was initiated.   The patient tolerated the procedure well without any immediate complications and was extubated and transferred to the recovery room in stable condition.  Urine was clear on fast CBI.  Disposition: Stable to PACU  Plan: Wean CBI in PACU, anticipate discharge home today with Foley removal in clinic in 2-3 days Do not resume Plavix until 11/15, discussed with urology prior to resuming if urine remains bloody, okay to continue baby aspirin  Legrand Rams, MD 08/03/2022

## 2022-08-04 ENCOUNTER — Encounter: Payer: Self-pay | Admitting: Urology

## 2022-08-06 ENCOUNTER — Ambulatory Visit (INDEPENDENT_AMBULATORY_CARE_PROVIDER_SITE_OTHER): Payer: Medicare Other | Admitting: Physician Assistant

## 2022-08-06 DIAGNOSIS — N401 Enlarged prostate with lower urinary tract symptoms: Secondary | ICD-10-CM

## 2022-08-06 DIAGNOSIS — N138 Other obstructive and reflux uropathy: Secondary | ICD-10-CM

## 2022-08-06 LAB — SURGICAL PATHOLOGY

## 2022-08-06 NOTE — Progress Notes (Signed)
Catheter Removal  Patient is present today for a catheter removal.  43ml of water was drained from the balloon. A 24FR three-way foley cath was removed from the bladder, no complications were noted. Patient tolerated well.  Performed by: Carman Ching, PA-C   Additional notes: Counseled patient on normal postoperative findings including dysuria, gross hematuria, and urinary urgency/leakage. Counseled patient to begin Kegel exercises 3x10 sets daily to increase urinary control and wear absorbent products as needed for security. Written and verbal resources provided today. Surgical pathology pending; will defer to Dr. Richardo Hanks to share results when available.   We discussed that he may resume Plavix in 2 days if his urine is clear; if his urine remains bloody he should call our office first before resuming Plavix.  Follow up: 3 month postop f/u with Dr. Richardo Hanks

## 2022-08-06 NOTE — Patient Instructions (Addendum)
If your urine is clear by Wednesday, you may resume your Plavix that day. If your urine remains bloody, please call our office so we may discuss.  Congratulations on your recent HOLEP procedure! As discussed in clinic today, these are the three normal postoperative findings after this surgery: Burning or pain with urination: This typically resolves within 1 week of surgery. If you are still having significant pain with urination 10 days after surgery, please call our clinic. We may need to check you for a urinary tract infection at that point, though this is rare. Blood in the urine: This may either come and go or steadily improve before going away, but typically resolves completely within 3 weeks of surgery. If you are on blood thinners, it may take longer for the bleeding to resolve. Please note that you may find that you pass clumps of tissue or debris around 10-14 days after surgery associated with some new bleeding. This is due to sloughing of your postoperative scab and is also normal. If at any point you start to pass dark red urine; thick, ketchup-like urine; or large blood clots around the size of your palm, please call our office immediately. Urinary leakage or urgency: This tends to improve with time, with most patients becoming dry within around 3 months of surgery. You may wear absorbant underwear or liners for security during this time. To help you get dry faster, please make sure you are completing your Kegel exercises as instructed, with a set of 10 exercises completed up to three times daily. Further information on Kegel exercises can be found in the back of this packet.

## 2022-08-21 ENCOUNTER — Ambulatory Visit (INDEPENDENT_AMBULATORY_CARE_PROVIDER_SITE_OTHER): Payer: Medicare Other | Admitting: Vascular Surgery

## 2022-08-21 ENCOUNTER — Encounter (INDEPENDENT_AMBULATORY_CARE_PROVIDER_SITE_OTHER): Payer: Self-pay | Admitting: Vascular Surgery

## 2022-08-21 ENCOUNTER — Ambulatory Visit (INDEPENDENT_AMBULATORY_CARE_PROVIDER_SITE_OTHER): Payer: Medicare Other

## 2022-08-21 VITALS — BP 127/74 | HR 61 | Resp 17 | Ht 76.0 in | Wt 228.0 lb

## 2022-08-21 DIAGNOSIS — I739 Peripheral vascular disease, unspecified: Secondary | ICD-10-CM | POA: Diagnosis not present

## 2022-08-21 DIAGNOSIS — I6522 Occlusion and stenosis of left carotid artery: Secondary | ICD-10-CM | POA: Diagnosis not present

## 2022-08-21 DIAGNOSIS — E785 Hyperlipidemia, unspecified: Secondary | ICD-10-CM

## 2022-08-21 DIAGNOSIS — I6523 Occlusion and stenosis of bilateral carotid arteries: Secondary | ICD-10-CM

## 2022-08-21 DIAGNOSIS — I10 Essential (primary) hypertension: Secondary | ICD-10-CM

## 2022-08-21 NOTE — Progress Notes (Signed)
MRN : [id number]  Randy Shaffer is a 71 y.o. ([birth year]) male who presents with chief complaint of No chief complaint on file. Marland Kitchen  History of Present Illness: Patient returns in follow-up of his carotid disease.  He is doing well.  He is status post left carotid stent placement about 4 months ago.  He is doing well.  He still has some occasional lightheadedness but is not having the severe dizziness he was having prior to the repair.  He at times has had some bradycardic episodes and this seems to be when he gets the most lightheaded.  He is overall doing well.  He is not any focal neurologic symptoms.  He has been complaining of a little bit of swelling in his legs and some pain with activity.  He says that he had a home health screening study which showed potentially reduced blood flow in his legs but it was "marginally reduced".  He has not had any lower extremity interventions to his knowledge.  Duplex today shows his left carotid stent to be widely patent and very mild right carotid stenosis in the lower end of the 1 to 39% range.  Current Outpatient Medications  Medication Sig Dispense Refill   aspirin EC 81 MG tablet Take 81 mg by mouth daily. Swallow whole.     atorvastatin (LIPITOR) 40 MG tablet Take 20 mg by mouth in the morning.     clopidogrel (PLAVIX) 75 MG tablet Take 1 tablet (75 mg total) by mouth daily. 30 tablet 6   traZODone (DESYREL) 100 MG tablet Take 50 mg by mouth at bedtime as needed for sleep. 75mg  per patient     No current facility-administered medications for this visit.    Past Medical History:  Diagnosis Date   Anginal pain (HCC)    Aortic atherosclerosis (HCC)    BPH (benign prostatic hyperplasia)    CAD (coronary artery disease) 12/13/2021   a.) R/LHC 12/13/2021: EF 55-65%, 25% dLM-oLAD, 25% pLAD, 50% m-dLAD; mPA 20, mPCWP 10, Ao sat 96, PA sat 71, CO 5.7.   Cardiac murmur    Carotid artery disease (HCC)    a.) CTA neck 03/02/2022: 70% LICA; b.) PTA  04/19/2022: 80% LICA --> POBA performed  followed by placement of a 9 mm prox, 58mm distal, 4 cm long exact stent placed; 10-15% residual stenosis; c.) carotid doppler 05/22/2022: 1-39% BICA   Complication of anesthesia    a.) PONV   Depression    Diet-controlled type 2 diabetes mellitus (HCC)    Diverticulosis 09/05/2011   DOE (dyspnea on exertion)    History of bilateral cataract extraction    HLD (hyperlipidemia)    Hypertension    Insomnia    a.) takes trazodone PRN   Long term current use of antithrombotics/antiplatelets    a.) on daily DAPT (ASA + clopidogrel)   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    OA (osteoarthritis) of knee    PONV (postoperative nausea and vomiting)    SEVERE   Sleep apnea    a.) unable to tolerate nocturnal PAP therapy    Past Surgical History:  Procedure Laterality Date   AMPUTATION FINGER / THUMB Right    THUMB PLUS 3 RECONSTRUCTIVE SURGERIES   CAROTID PTA/STENT INTERVENTION Left 04/16/2022   Procedure: CAROTID PTA/STENT INTERVENTION;  Surgeon: 04/18/2022, MD;  Location: [facility];  Service: Cardiovascular;  Laterality: Left;   HEAD LACERATION REPAIR     HERNIA REPAIR  2000  ONE AS INFANT   HIP NERVE RELEASE     HOLEP-LASER ENUCLEATION OF THE PROSTATE WITH MORCELLATION N/A 08/03/2022   Procedure: HOLEP-LASER ENUCLEATION OF THE PROSTATE WITH MORCELLATION;  Surgeon: Sondra Come, MD;  Location: [facility];  Service: Urology;  Laterality: N/A;   KNEE ARTHROSCOPY Right 2003   KNEE ARTHROSCOPY Right 02/23/2015   Procedure: ARTHROSCOPY KNEE WITH DEBRIDEMENT PARTIAL MEDIAL MENISECTOMY;  Surgeon: Christena Flake, MD;  Location: [facility];  Service: Orthopedics;  Laterality: Right;   PHOTOREFRACTIVE KERATOTOMY     RIGHT/LEFT HEART CATH AND CORONARY ANGIOGRAPHY N/A 12/13/2021   Procedure: RIGHT/LEFT HEART CATH AND CORONARY ANGIOGRAPHY;  Surgeon: Alwyn Pea, MD;  Location: [facility];  Service: Cardiovascular;  Laterality: N/A;      Social History   Tobacco Use   Smoking status: Never  Vaping Use   Vaping Use: Never used  Substance Use Topics   Alcohol use: Yes    Comment: 2-3 BEER/MONTH   Drug use: No      Family History  Problem Relation Age of Onset   Heart disease Mother    Cancer Mother    Heart attack Father    Cancer Sister    Cancer Brother    Heart attack Maternal Uncle      Allergies  Allergen Reactions   Thiopental Sodium [Thiopental] Nausea And Vomiting    SEVERE VOMITING     REVIEW OF SYSTEMS (Negative unless checked)  Constitutional: [] Weight loss  [] Fever  [] Chills Cardiac: [] Chest pain   [] Chest pressure   [] Palpitations   [] Shortness of breath when laying flat   [] Shortness of breath at rest   [] Shortness of breath with exertion. Vascular:  [] Pain in legs with walking   [] Pain in legs at rest   [] Pain in legs when laying flat   [] Claudication   [] Pain in feet when walking  [] Pain in feet at rest  [] Pain in feet when laying flat   [] History of DVT   [] Phlebitis   [] Swelling in legs   [] Varicose veins   [] Non-healing ulcers Pulmonary:   [] Uses home oxygen   [] Productive cough   [] Hemoptysis   [] Wheeze  [] COPD   [] Asthma Neurologic:  [x] Dizziness  [] Blackouts   [] Seizures   [] History of stroke   [] History of TIA  [] Aphasia   [] Temporary blindness   [] Dysphagia   [] Weakness or numbness in arms   [] Weakness or numbness in legs Musculoskeletal:  [x] Arthritis   [] Joint swelling   [] Joint pain   [] Low back pain Hematologic:  [] Easy bruising  [] Easy bleeding   [] Hypercoagulable state   [] Anemic  [] Hepatitis Gastrointestinal:  [] Blood in stool   [] Vomiting blood  [] Gastroesophageal reflux/heartburn   [] Difficulty swallowing. Genitourinary:  [] Chronic kidney disease   [] Difficult urination  [] Frequent urination  [] Burning with urination   [] Blood in urine Skin:  [] Rashes   [] Ulcers   [] Wounds Psychological:  [] History of anxiety   []  History of major depression.  Physical  Examination  Vitals:   08/21/22 0850  BP: 127/74  Pulse: 61  Resp: 17  Weight: 228 lb (103.4 kg)  Height: 6\' 4"  (1.93 m)   Body mass index is 27.75 kg/m. Gen:  WD/WN, NAD Head: [place]/AT, No temporalis wasting. Ear/Nose/Throat: Hearing grossly intact, nares w/o erythema or drainage, trachea midline Eyes: Conjunctiva clear. Sclera non-icteric Neck: Supple.  No bruit  Pulmonary:  Good air movement, equal and clear to auscultation bilaterally.  Cardiac: RRR, No JVD Vascular:  Vessel Right  Left  Radial Palpable Palpable                          PT 1+ palpable 1+ palpable  DP 2+ palpable 2+ palpable   Gastrointestinal: soft, non-tender/non-distended. No guarding/reflex.  Musculoskeletal: M/S 5/5 throughout.  No deformity or atrophy.  Trace lower extremity edema. Neurologic: CN 2-12 intact. Sensation grossly intact in extremities.  Symmetrical.  Speech is fluent. Motor exam as listed above. Psychiatric: Judgment intact, Mood & affect appropriate for pt's clinical situation. Dermatologic: No rashes or ulcers noted.  No cellulitis or open wounds.     CBC Lab Results  Component Value Date   WBC 6.2 04/17/2022   HGB 14.3 04/17/2022   HCT 42.0 04/17/2022   MCV 85.4 04/17/2022   PLT 167 04/17/2022    BMET    Component Value Date/Time   NA 137 04/17/2022 0524   NA 144 02/16/2013 1623   K 4.1 04/17/2022 0524   K 4.2 02/16/2013 1623   CL 107 04/17/2022 0524   CL 108 (H) 02/16/2013 1623   CO2 25 04/17/2022 0524   CO2 29 02/16/2013 1623   GLUCOSE 117 (H) 04/17/2022 0524   GLUCOSE 103 (H) 02/16/2013 1623   BUN 13 04/17/2022 0524   BUN 24 (H) 02/16/2013 1623   CREATININE 1.18 04/17/2022 0524   CREATININE 1.29 02/16/2013 1623   CALCIUM 8.2 (L) 04/17/2022 0524   CALCIUM 9.6 02/16/2013 1623   GFRNONAA >60 04/17/2022 0524   GFRNONAA 59 (L) 02/16/2013 1623   GFRAA >60 02/16/2013 1623   CrCl cannot be calculated (Patient's most recent lab result is older than the maximum  21 days allowed.).  COAG No results found for: "INR", "PROTIME"  Radiology No results found.   Assessment/Plan Carotid stenosis, left Duplex today shows his left carotid stent to be widely patent and very mild right carotid stenosis in the lower end of the 1 to 39% range.  Doing well.  Continue aspirin, Plavix, and Lipitor.  Recheck in 6 months.  Hypertension blood pressure control important in reducing the progression of atherosclerotic disease. On appropriate oral medications.   PAD (peripheral artery disease) (HCC) Had a home health screening which showed potential reduction in blood flow.  Does not have any limb threatening symptoms.  May have some mild claudication.  At his next visit, we will check ABIs for further evaluation  Hyperlipidemia lipid control important in reducing the progression of atherosclerotic disease. Continue statin therapy    Festus Barren, MD  08/21/2022 10:16 AM    This note was created with Dragon medical transcription system.  Any errors from dictation are purely unintentional

## 2022-08-21 NOTE — Assessment & Plan Note (Signed)
blood pressure control important in reducing the progression of atherosclerotic disease. On appropriate oral medications.  

## 2022-08-21 NOTE — Assessment & Plan Note (Signed)
lipid control important in reducing the progression of atherosclerotic disease. Continue statin therapy  

## 2022-08-21 NOTE — Assessment & Plan Note (Signed)
Had a home health screening which showed potential reduction in blood flow.  Does not have any limb threatening symptoms.  May have some mild claudication.  At his next visit, we will check ABIs for further evaluation

## 2022-08-21 NOTE — Assessment & Plan Note (Signed)
Duplex today shows his left carotid stent to be widely patent and very mild right carotid stenosis in the lower end of the 1 to 39% range.  Doing well.  Continue aspirin, Plavix, and Lipitor.  Recheck in 6 months.

## 2022-09-12 ENCOUNTER — Other Ambulatory Visit (INDEPENDENT_AMBULATORY_CARE_PROVIDER_SITE_OTHER): Payer: Self-pay | Admitting: Vascular Surgery

## 2022-10-10 ENCOUNTER — Other Ambulatory Visit: Payer: Self-pay | Admitting: Specialist

## 2022-10-10 DIAGNOSIS — R0602 Shortness of breath: Secondary | ICD-10-CM

## 2022-10-16 ENCOUNTER — Ambulatory Visit
Admission: RE | Admit: 2022-10-16 | Discharge: 2022-10-16 | Disposition: A | Discharge status: Home / Self Care | Payer: Medicare Other | Source: Ambulatory Visit | Attending: Specialist | Admitting: Specialist

## 2022-10-16 DIAGNOSIS — R0602 Shortness of breath: Secondary | ICD-10-CM | POA: Diagnosis not present

## 2022-10-25 ENCOUNTER — Ambulatory Visit: Payer: Medicare Other | Admitting: Urology

## 2022-10-25 ENCOUNTER — Encounter: Payer: Self-pay | Admitting: Urology

## 2022-10-25 VITALS — BP 107/69 | HR 50 | Ht 76.0 in | Wt 237.0 lb

## 2022-10-25 DIAGNOSIS — N138 Other obstructive and reflux uropathy: Secondary | ICD-10-CM

## 2022-10-25 DIAGNOSIS — N401 Enlarged prostate with lower urinary tract symptoms: Secondary | ICD-10-CM

## 2022-10-25 LAB — BLADDER SCAN AMB NON-IMAGING

## 2022-10-25 NOTE — Progress Notes (Signed)
   10/25/2022 8:19 AM   Randy Shaffer 1950/12/27 [id number]  Reason for visit: Follow up BPH status post HOLEP  HPI: 72 year old male with BPH and obstructive symptoms refractory to medical management, PVRs greater than 4100ml, who opted for HOLEP.  He underwent uncomplicated HOLEP on 62/94/7654 with removal of 35 g of benign prostate tissue.  Overall, he is doing very well.  He reports a much stronger stream, only having nocturia 0-1 time overnight, denies any significant incontinence.  Bladder scan today 333ml, but he denies the urge to void.  Notably on the bladder scan there appeared to be a bilobed bladder which would be consistent with the large posterior bladder diverticulum seen at the time of surgery.  He may actually benefit from leaning forward during voiding which could help with this posterior bladder diverticulum empty into the bladder and improve his emptying.  He denies any gross hematuria in the last month.  Return precautions were discussed including UTIs or flank pain.  Consider renal ultrasound in the future if chronic incomplete emptying from posterior bladder diverticulum.  Do not anticipate he would need diverticulectomy  RTC 9 months PVR  [place], [name] [name], [name] [address] [phone]

## 2022-11-27 ENCOUNTER — Ambulatory Visit (INDEPENDENT_AMBULATORY_CARE_PROVIDER_SITE_OTHER): Payer: Medicare Other

## 2022-11-27 ENCOUNTER — Ambulatory Visit (INDEPENDENT_AMBULATORY_CARE_PROVIDER_SITE_OTHER): Payer: Medicare Other | Admitting: Vascular Surgery

## 2022-11-27 ENCOUNTER — Encounter (INDEPENDENT_AMBULATORY_CARE_PROVIDER_SITE_OTHER): Payer: Self-pay | Admitting: Vascular Surgery

## 2022-11-27 VITALS — BP 106/61 | HR 46 | Resp 18 | Ht 76.0 in | Wt 231.0 lb

## 2022-11-27 DIAGNOSIS — I10 Essential (primary) hypertension: Secondary | ICD-10-CM | POA: Diagnosis not present

## 2022-11-27 DIAGNOSIS — E785 Hyperlipidemia, unspecified: Secondary | ICD-10-CM | POA: Diagnosis not present

## 2022-11-27 DIAGNOSIS — I6522 Occlusion and stenosis of left carotid artery: Secondary | ICD-10-CM | POA: Diagnosis not present

## 2022-11-27 DIAGNOSIS — I739 Peripheral vascular disease, unspecified: Secondary | ICD-10-CM | POA: Diagnosis not present

## 2022-11-27 NOTE — Assessment & Plan Note (Signed)
noninvasive studies were performed today which showed normal triphasic waveforms and a normal ABI as a digital pressures bilaterally consistent with arterial insufficiency.  He had been told by outpatient home health screening study that he had PAD and he may have some degree of calcification, but he has no flow limitation and no further workup is planned to evaluate this.

## 2022-11-27 NOTE — Progress Notes (Signed)
MRN : [id number]  Randy Shaffer is a 72 y.o. (02/06/1951) male who presents with chief complaint of  Chief Complaint  Patient presents with   Follow-up    f/u in 6 months with abi and carotid  .  History of Present Illness: Patient returns in follow-up primarily of his carotid disease but has multiple other issues and complaints.  About 7 months ago, he underwent left carotid stent placement for high-grade stenosis with profound dizziness.  Initially, his dizziness got better but that is getting worse again.  He has felt like he was going to pass out on several occasions.  He has been complaining of more leg pain and difficult to control hypertension.  Although his blood pressure is fairly normal today, his heart rate remains in the 40s.  He already follows with cardiology who has been adjusting his medications appropriately.  To evaluate his leg pain, noninvasive studies were performed today which showed normal triphasic waveforms and a normal ABI as a digital pressures bilaterally consistent with arterial insufficiency. Duplex today shows a widely patent left carotid stent and 1-39% stenosis which is minimal.  Current Outpatient Medications  Medication Sig Dispense Refill   albuterol (VENTOLIN HFA) 108 (90 Base) MCG/ACT inhaler Inhale into the lungs.     aspirin EC 81 MG tablet Take 81 mg by mouth daily. Swallow whole.     atorvastatin (LIPITOR) 40 MG tablet Take 20 mg by mouth in the morning.     clopidogrel (PLAVIX) 75 MG tablet TAKE 1 TABLET BY MOUTH EVERY DAY 90 tablet 2   isosorbide mononitrate (IMDUR) 30 MG 24 hr tablet Take 30 mg by mouth daily.     metoprolol succinate (TOPROL-XL) 25 MG 24 hr tablet Take 12.5 mg by mouth in the morning and at bedtime.     PARoxetine (PAXIL) 20 MG tablet TAKE 1/2 TAB DAILY FOR A WEEK THEN INCREASE TO 1 TAB DAILY AND CONTINUE THAT DOSE     traZODone (DESYREL) 50 MG tablet TAKE 1.5-2 TABS BY MOUTH AT NIGHT FOR SLEEP     FLUoxetine (PROZAC) 20 MG  capsule Take 40 mg by mouth every morning. (Patient not taking: Reported on 11/27/2022)     tamsulosin (FLOMAX) 0.4 MG CAPS capsule Take 0.4 mg by mouth daily. (Patient not taking: Reported on 11/27/2022)     No current facility-administered medications for this visit.    Past Medical History:  Diagnosis Date   Anginal pain ([place])    Aortic atherosclerosis (HCC)    BPH (benign prostatic hyperplasia)    CAD (coronary artery disease) 12/13/2021   a.) R/LHC 12/13/2021: EF 55-65%, 25% dLM-oLAD, 25% pLAD, 50% m-dLAD; mPA 20, mPCWP 10, Ao sat 96, PA sat 71, CO 5.7.   Cardiac murmur    Carotid artery disease ([place])    a.) CTA neck [id number]: [id number] LICA; b.) PTA [id number]: [id number] LICA --> POBA performed  followed by placement of a 9 mm prox, 55m distal, 4 cm long exact stent placed; 10-15% residual stenosis; c.) carotid doppler 05/22/2022: [id number]   Complication of anesthesia    a.) PONV   Depression    Diet[place]controlled [place] 2 diabetes mellitus [place]    Diverticulosis 09/05/2011   DOE (dyspnea on exertion)    History of bilateral cataract extraction    HLD (hyperlipidemia)    Hypertension    Insomnia    a.) takes trazodone PRN   Long term current use of antithrombotics/antiplatelets    a.) on daily DAPT (  ASA + clopidogrel)   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    OA (osteoarthritis) of knee    PONV (postoperative nausea and vomiting)    SEVERE   Sleep apnea    a.) unable to tolerate nocturnal PAP therapy    Past Surgical History:  Procedure Laterality Date   AMPUTATION FINGER / THUMB Right    THUMB PLUS 3 RECONSTRUCTIVE SURGERIES   CAROTID PTA/STENT INTERVENTION Left 04/16/2022   Procedure: CAROTID PTA/STENT INTERVENTION;  Surgeon: Algernon Huxley, MD;  Location: [facility];  Service: Cardiovascular;  Laterality: Left;   HEAD LACERATION REPAIR     HERNIA REPAIR  2000   ONE AS INFANT   HIP NERVE RELEASE     HOLEP-LASER ENUCLEATION OF THE PROSTATE WITH MORCELLATION N/A 08/03/2022    Procedure: HOLEP-LASER ENUCLEATION OF THE PROSTATE WITH MORCELLATION;  Surgeon: Billey Co, MD;  Location: [facility];  Service: Urology;  Laterality: N/A;   KNEE ARTHROSCOPY Right 2003   KNEE ARTHROSCOPY Right 02/23/2015   Procedure: ARTHROSCOPY KNEE WITH DEBRIDEMENT PARTIAL MEDIAL MENISECTOMY;  Surgeon: Corky Mull, MD;  Location: [facility];  Service: Orthopedics;  Laterality: Right;   PHOTOREFRACTIVE KERATOTOMY     RIGHT/LEFT HEART CATH AND CORONARY ANGIOGRAPHY N/A 12/13/2021   Procedure: RIGHT/LEFT HEART CATH AND CORONARY ANGIOGRAPHY;  Surgeon: Yolonda Kida, MD;  Location: [facility];  Service: Cardiovascular;  Laterality: N/A;     Social History   Tobacco Use   Smoking status: Never    Passive exposure: Never   Smokeless tobacco: Never  Vaping Use   Vaping Use: Never used  Substance Use Topics   Alcohol use: Yes    Comment: 2-3 BEER/MONTH   Drug use: No      Family History  Problem Relation Age of Onset   Heart disease Mother    Cancer Mother    Heart attack Father    Cancer Sister    Cancer Brother    Heart attack Maternal Uncle      Allergies  Allergen Reactions   Thiopental Sodium [Thiopental] Nausea And Vomiting    SEVERE VOMITING    REVIEW OF SYSTEMS (Negative unless checked)   Constitutional: '[]'$ Weight loss  '[]'$ Fever  '[]'$ Chills Cardiac: '[]'$ Chest pain   '[]'$ Chest pressure   '[]'$ Palpitations   '[]'$ Shortness of breath when laying flat   '[]'$ Shortness of breath at rest   '[]'$ Shortness of breath with exertion. Vascular:  '[]'$ Pain in legs with walking   '[]'$ Pain in legs at rest   '[]'$ Pain in legs when laying flat   '[]'$ Claudication   '[]'$ Pain in feet when walking  '[]'$ Pain in feet at rest  '[]'$ Pain in feet when laying flat   '[]'$ History of DVT   '[]'$ Phlebitis   '[]'$ Swelling in legs   '[]'$ Varicose veins   '[]'$ Non-healing ulcers Pulmonary:   '[]'$ Uses home oxygen   '[]'$ Productive cough   '[]'$ Hemoptysis   '[]'$ Wheeze  '[]'$ COPD   '[]'$ Asthma Neurologic:  '[x]'$ Dizziness  '[]'$ Blackouts    '[]'$ Seizures   '[]'$ History of stroke   '[]'$ History of TIA  '[]'$ Aphasia   '[]'$ Temporary blindness   '[]'$ Dysphagia   '[]'$ Weakness or numbness in arms   '[]'$ Weakness or numbness in legs Musculoskeletal:  '[x]'$ Arthritis   '[]'$ Joint swelling   '[]'$ Joint pain   '[]'$ Low back pain Hematologic:  '[]'$ Easy bruising  '[]'$ Easy bleeding   '[]'$ Hypercoagulable state   '[]'$ Anemic  '[]'$ Hepatitis Gastrointestinal:  '[]'$ Blood in stool   '[]'$ Vomiting blood  '[]'$ Gastroesophageal reflux/heartburn   '[]'$ Difficulty swallowing. Genitourinary:  '[]'$ Chronic kidney disease   '[]'$   Difficult urination  '[]'$ Frequent urination  '[]'$ Burning with urination   '[]'$ Blood in urine Skin:  '[]'$ Rashes   '[]'$ Ulcers   '[]'$ Wounds Psychological:  '[]'$ History of anxiety   '[]'$  History of major depression.  Physical Examination  Vitals:   11/27/22 0836  BP: 106/61  Pulse: (!) 46  Resp: 18  Weight: 231 lb (104.8 kg)  Height: '6\' 4"'$  (1.93 m)   Body mass index is 28.12 kg/m. Gen:  WD/WN, NAD Head: [place]/AT, No temporalis wasting. Ear/Nose/Throat: Hearing grossly intact, nares w/o erythema or drainage, trachea midline Eyes: Conjunctiva clear. Sclera non-icteric Neck: Supple.  No bruit  Pulmonary:  Good air movement, equal and clear to auscultation bilaterally.  Cardiac: RRR, No JVD Vascular:  Vessel Right Left  Radial Palpable Palpable                          PT Palpable Palpable  DP Palpable Palpable    Musculoskeletal: M/S 5/5 throughout.  No deformity or atrophy. No edema. Neurologic: CN 2-12 intact. Sensation grossly intact in extremities.  Symmetrical.  Speech is fluent. Motor exam as listed above. Psychiatric: Judgment intact, Mood & affect appropriate for pt's clinical situation. Dermatologic: No rashes or ulcers noted.  No cellulitis or open wounds.     CBC Lab Results  Component Value Date   WBC 6.2 04/17/2022   HGB 14.3 04/17/2022   HCT 42.0 04/17/2022   MCV 85.4 04/17/2022   PLT 167 04/17/2022    BMET    Component Value Date/Time   NA 137 04/17/2022 0524   NA  144 02/16/2013 1623   K 4.1 04/17/2022 0524   K 4.2 02/16/2013 1623   CL 107 04/17/2022 0524   CL 108 (H) 02/16/2013 1623   CO2 25 04/17/2022 0524   CO2 29 02/16/2013 1623   GLUCOSE 117 (H) 04/17/2022 0524   GLUCOSE 103 (H) 02/16/2013 1623   BUN 13 04/17/2022 0524   BUN 24 (H) 02/16/2013 1623   CREATININE 1.18 04/17/2022 0524   CREATININE 1.29 02/16/2013 1623   CALCIUM 8.2 (L) 04/17/2022 0524   CALCIUM 9.6 02/16/2013 1623   GFRNONAA >60 04/17/2022 0524   GFRNONAA 59 (L) 02/16/2013 1623   GFRAA >60 02/16/2013 1623   CrCl cannot be calculated (Patient's most recent lab result is older than the maximum 21 days allowed.).  COAG No results found for: "INR", "PROTIME"  Radiology No results found.   Assessment/Plan PAD (peripheral artery disease) (HCC) noninvasive studies were performed today which showed normal triphasic waveforms and a normal ABI as a digital pressures bilaterally consistent with arterial insufficiency.  He had been told by outpatient home health screening study that he had PAD and he may have some degree of calcification, but he has no flow limitation and no further workup is planned to evaluate this.  Carotid stenosis, left Duplex today shows a widely patent left carotid stent and 1-39% stenosis which is minimal. Unlikely the cause of his current symptoms. Recheck in 6 months.  Continue current medications.   Hypertension blood pressure control important in reducing the progression of atherosclerotic disease. On appropriate oral medications.  With suboptimal control, renal artery duplex is a reasonable option and we can get this done in the near future at his convenience.    Hyperlipidemia lipid control important in reducing the progression of atherosclerotic disease. Continue statin therapy  Leotis Pain, MD  11/27/2022 9:33 AM    This note was created with Dragon medical transcription system.  Any errors from dictation are purely unintentional

## 2022-11-27 NOTE — Assessment & Plan Note (Signed)
Duplex today shows a widely patent left carotid stent and 1-39% stenosis which is minimal. Unlikely the cause of his current symptoms. Recheck in 6 months.  Continue current medications.

## 2022-12-24 DIAGNOSIS — R7303 Prediabetes: Secondary | ICD-10-CM | POA: Insufficient documentation

## 2022-12-25 ENCOUNTER — Telehealth: Payer: Self-pay | Admitting: Urology

## 2022-12-25 ENCOUNTER — Ambulatory Visit (INDEPENDENT_AMBULATORY_CARE_PROVIDER_SITE_OTHER): Payer: Medicare Other | Admitting: Vascular Surgery

## 2022-12-25 ENCOUNTER — Encounter (INDEPENDENT_AMBULATORY_CARE_PROVIDER_SITE_OTHER): Payer: Self-pay | Admitting: Vascular Surgery

## 2022-12-25 ENCOUNTER — Ambulatory Visit (INDEPENDENT_AMBULATORY_CARE_PROVIDER_SITE_OTHER): Payer: Medicare Other

## 2022-12-25 VITALS — BP 132/76 | HR 46 | Resp 12 | Ht 76.0 in | Wt 231.0 lb

## 2022-12-25 DIAGNOSIS — I10 Essential (primary) hypertension: Secondary | ICD-10-CM | POA: Diagnosis not present

## 2022-12-25 DIAGNOSIS — E785 Hyperlipidemia, unspecified: Secondary | ICD-10-CM

## 2022-12-25 DIAGNOSIS — I6522 Occlusion and stenosis of left carotid artery: Secondary | ICD-10-CM | POA: Diagnosis not present

## 2022-12-25 NOTE — Assessment & Plan Note (Signed)
Follow-up carotid duplex scheduled in the coming months.  At last check, his carotid stent was widely patent.

## 2022-12-25 NOTE — Progress Notes (Signed)
MRN : [id number]  Randy Shaffer is a 72 y.o. (Sep 26, 1950) male who presents with chief complaint of  Chief Complaint  Patient presents with   Follow-up    ultrasound  .  History of Present Illness: Patient returns today in follow up.  His blood pressure control seems to be somewhat better but his heart rate remains quite low and he is still complaining of a lot of dizziness.  He recently had a carotid duplex and is scheduled to have another duplex in follow-up of his previous carotid stent which was widely patent.  He had no lower extremity arterial insufficiency on noninvasive studies last month.  Today, we checked a renal artery duplex to evaluate his hypertension.  No hemodynamically significant renal artery stenosis identified in either kidney with normal kidney length seen bilaterally.  Current Outpatient Medications  Medication Sig Dispense Refill   albuterol (VENTOLIN HFA) 108 (90 Base) MCG/ACT inhaler Inhale into the lungs.     aspirin EC 81 MG tablet Take 81 mg by mouth daily. Swallow whole.     atorvastatin (LIPITOR) 40 MG tablet Take 20 mg by mouth in the morning.     clopidogrel (PLAVIX) 75 MG tablet TAKE 1 TABLET BY MOUTH EVERY DAY 90 tablet 2   FLUoxetine (PROZAC) 20 MG capsule Take 40 mg by mouth every morning.     isosorbide mononitrate (IMDUR) 30 MG 24 hr tablet Take 30 mg by mouth daily.     metoprolol succinate (TOPROL-XL) 25 MG 24 hr tablet Take 12.5 mg by mouth in the morning and at bedtime.     PARoxetine (PAXIL) 20 MG tablet TAKE 1/2 TAB DAILY FOR A WEEK THEN INCREASE TO 1 TAB DAILY AND CONTINUE THAT DOSE     traZODone (DESYREL) 50 MG tablet TAKE 1.5-2 TABS BY MOUTH AT NIGHT FOR SLEEP     tamsulosin (FLOMAX) 0.4 MG CAPS capsule Take 0.4 mg by mouth daily. (Patient not taking: Reported on 12/25/2022)     No current facility-administered medications for this visit.    Past Medical History:  Diagnosis Date   Anginal pain    Aortic atherosclerosis    BPH (benign  prostatic hyperplasia)    CAD (coronary artery disease) 12/13/2021   a.) R/LHC 12/13/2021: EF 55-65%, 25% dLM-oLAD, 25% pLAD, 50% m-dLAD; mPA 20, mPCWP 10, Ao sat 96, PA sat 71, CO 5.7.   Cardiac murmur    Carotid artery disease    a.) CTA neck [id number]: [id number] LICA; b.) PTA [id number]: [id number] LICA --> POBA performed  followed by placement of a 9 mm prox, 28mm distal, 4 cm long exact stent placed; 10-15% residual stenosis; c.) carotid doppler 05/22/2022: [id number] BICA   Complication of anesthesia    a.) PONV   Depression    Diet-controlled type 2 diabetes mellitus    Diverticulosis 09/05/2011   DOE (dyspnea on exertion)    History of bilateral cataract extraction    HLD (hyperlipidemia)    Hypertension    Insomnia    a.) takes trazodone PRN   Long term current use of antithrombotics/antiplatelets    a.) on daily DAPT (ASA + clopidogrel)   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    OA (osteoarthritis) of knee    PONV (postoperative nausea and vomiting)    SEVERE   Sleep apnea    a.) unable to tolerate nocturnal PAP therapy    Past Surgical History:  Procedure Laterality Date   AMPUTATION FINGER / THUMB Right  THUMB PLUS 3 RECONSTRUCTIVE SURGERIES   CAROTID PTA/STENT INTERVENTION Left 04/16/2022   Procedure: CAROTID PTA/STENT INTERVENTION;  Surgeon: Algernon Huxley, MD;  Location: [facility];  Service: Cardiovascular;  Laterality: Left;   HEAD LACERATION REPAIR     HERNIA REPAIR  2000   ONE AS INFANT   HIP NERVE RELEASE     HOLEP-LASER ENUCLEATION OF THE PROSTATE WITH MORCELLATION N/A 08/03/2022   Procedure: HOLEP-LASER ENUCLEATION OF THE PROSTATE WITH MORCELLATION;  Surgeon: Billey Co, MD;  Location: [facility];  Service: Urology;  Laterality: N/A;   KNEE ARTHROSCOPY Right 2003   KNEE ARTHROSCOPY Right 02/23/2015   Procedure: ARTHROSCOPY KNEE WITH DEBRIDEMENT PARTIAL MEDIAL MENISECTOMY;  Surgeon: Corky Mull, MD;  Location: [facility];  Service: Orthopedics;  Laterality:  Right;   PHOTOREFRACTIVE KERATOTOMY     RIGHT/LEFT HEART CATH AND CORONARY ANGIOGRAPHY N/A 12/13/2021   Procedure: RIGHT/LEFT HEART CATH AND CORONARY ANGIOGRAPHY;  Surgeon: Yolonda Kida, MD;  Location: [facility];  Service: Cardiovascular;  Laterality: N/A;     Social History   Tobacco Use   Smoking status: Never    Passive exposure: Never   Smokeless tobacco: Never  Vaping Use   Vaping Use: Never used  Substance Use Topics   Alcohol use: Yes    Comment: 2-3 BEER/MONTH   Drug use: No      Family History  Problem Relation Age of Onset   Heart disease Mother    Cancer Mother    Heart attack Father    Cancer Sister    Cancer Brother    Heart attack Maternal Uncle      Allergies  Allergen Reactions   Thiopental Sodium [Thiopental] Nausea And Vomiting    SEVERE VOMITING     REVIEW OF SYSTEMS (Negative unless checked)  Constitutional: [] Weight loss  [] Fever  [] Chills Cardiac: [] Chest pain   [] Chest pressure   [] Palpitations   [] Shortness of breath when laying flat   [] Shortness of breath at rest   [] Shortness of breath with exertion. Vascular:  [] Pain in legs with walking   [] Pain in legs at rest   [] Pain in legs when laying flat   [] Claudication   [] Pain in feet when walking  [] Pain in feet at rest  [] Pain in feet when laying flat   [] History of DVT   [] Phlebitis   [] Swelling in legs   [] Varicose veins   [] Non-healing ulcers Pulmonary:   [] Uses home oxygen   [] Productive cough   [] Hemoptysis   [] Wheeze  [] COPD   [] Asthma Neurologic:  [x] Dizziness  [] Blackouts   [] Seizures   [] History of stroke   [] History of TIA  [] Aphasia   [] Temporary blindness   [] Dysphagia   [] Weakness or numbness in arms   [] Weakness or numbness in legs Musculoskeletal:  [x] Arthritis   [] Joint swelling   [] Joint pain   [] Low back pain Hematologic:  [] Easy bruising  [] Easy bleeding   [] Hypercoagulable state   [] Anemic   Gastrointestinal:  [] Blood in stool   [] Vomiting blood   [] Gastroesophageal reflux/heartburn   [] Abdominal pain Genitourinary:  [] Chronic kidney disease   [] Difficult urination  [] Frequent urination  [] Burning with urination   [] Hematuria Skin:  [] Rashes   [] Ulcers   [] Wounds Psychological:  [] History of anxiety   []  History of major depression.  Physical Examination  BP 132/76 (BP Location: Left Arm)   Pulse (!) 46   Resp 12   Ht 6\' 4"  (1.93 m)   Wt  231 lb (104.8 kg)   BMI 28.12 kg/m  Gen:  WD/WN, NAD. Appears younger than stated age. Head: [place]/AT, No temporalis wasting. Ear/Nose/Throat: Hearing grossly intact, nares w/o erythema or drainage Eyes: Conjunctiva clear. Sclera non-icteric Neck: Supple.  Trachea midline Pulmonary:  Good air movement, no use of accessory muscles.  Cardiac: RRR, no JVD Vascular:  Vessel Right Left  Radial Palpable Palpable                          PT Palpable Palpable  DP Palpable Palpable   Gastrointestinal: soft, non-tender/non-distended. No guarding/reflex.  Musculoskeletal: M/S 5/5 throughout.  No deformity or atrophy. No edema. Neurologic: Sensation grossly intact in extremities.  Symmetrical.  Speech is fluent.  Psychiatric: Judgment intact, Mood & affect appropriate for pt's clinical situation. Dermatologic: No rashes or ulcers noted.  No cellulitis or open wounds.      Labs Recent Results (from the past 2160 hour(s))  Bladder Scan (Post Void Residual) in office     Status: None   Collection Time: 10/25/22  8:25 AM  Result Value Ref Range   Scan Result 334ml     Radiology VAS US CAROTID  Result Date: 12/03/2022 Carotid Arterial Duplex Study Patient Name:  ROCKY VITALI  Date of Exam:   11/27/2022 Medical Rec #: [id number]        Accession #:    [id number] Date of Birth: 1951/05/17        Patient Gender: M Patient Age:   41 years Exam Location:  [facility] Procedure:      VAS US CAROTID Referring Phys: Leotis Pain  --------------------------------------------------------------------------------  Other Factors: 04/19/2022 Left ICA stent. Performing Technologist: Concha Norway RVT  Examination Guidelines: A complete evaluation includes B-mode imaging, spectral Doppler, color Doppler, and power Doppler as needed of all accessible portions of each vessel. Bilateral testing is considered an integral part of a complete examination. Limited examinations for reoccurring indications may be performed as noted.  Right Carotid Findings: +----------+--------+--------+--------+----------------------+--------+           PSV cm/sEDV cm/sStenosisPlaque Description    Comments +----------+--------+--------+--------+----------------------+--------+ CCA Prox  49      11                                             +----------+--------+--------+--------+----------------------+--------+ CCA Mid   45      10                                             +----------+--------+--------+--------+----------------------+--------+ CCA Distal35      9                                              +----------+--------+--------+--------+----------------------+--------+ ICA Prox  51      19      1-39%   smooth and homogeneous         +----------+--------+--------+--------+----------------------+--------+ ICA Mid   48      14                                             +----------+--------+--------+--------+----------------------+--------+  ICA Distal60      16                                             +----------+--------+--------+--------+----------------------+--------+ ECA       96      8                                              +----------+--------+--------+--------+----------------------+--------+ +----------+--------+-------+----------------+-------------------+           PSV cm/sEDV cmsDescribe        Arm Pressure (mmHG) +----------+--------+-------+----------------+-------------------+  [id number]             Multiphasic, WNL                    +----------+--------+-------+----------------+-------------------+ +---------+--------+--+--------+--+---------+ VertebralPSV cm/s34EDV cm/s10Antegrade +---------+--------+--+--------+--+---------+  Left Carotid Findings: +----------+--------+--------+--------+------------------+--------+           PSV cm/sEDV cm/sStenosisPlaque DescriptionComments +----------+--------+--------+--------+------------------+--------+ CCA Prox  68      9                                          +----------+--------+--------+--------+------------------+--------+ CCA Mid   54      9                                          +----------+--------+--------+--------+------------------+--------+ CCA Distal46      14                                stent    +----------+--------+--------+--------+------------------+--------+ ICA Prox  47      13                                stent    +----------+--------+--------+--------+------------------+--------+ ICA Mid   59      17                                         +----------+--------+--------+--------+------------------+--------+ ICA Distal79      21                                         +----------+--------+--------+--------+------------------+--------+ ECA       82      9                                          +----------+--------+--------+--------+------------------+--------+ +----------+--------+--------+----------------+-------------------+           PSV cm/sEDV cm/sDescribe        Arm Pressure (mmHG) +----------+--------+--------+----------------+-------------------+ [id number]              Multiphasic, WNL                    +----------+--------+--------+----------------+-------------------+ +---------+--------+--+--------+--+---------+  VertebralPSV cm/s35EDV cm/s10Antegrade +---------+--------+--+--------+--+---------+   Summary: Right  Carotid: Velocities in the right ICA are consistent with a 1-39% stenosis.                The extracranial vessels were near-normal with only minimal wall                thickening or plaque. Left Carotid: Widely patent ICA stent with no evidence of stenosis. Vertebrals:  Bilateral vertebral arteries demonstrate antegrade flow. Subclavians: Normal flow hemodynamics were seen in bilateral subclavian              arteries. *See table(s) above for measurements and observations.  Electronically signed by Leotis Pain MD on 12/03/2022 at 8:22:12 AM.    Final    VAS [place] ABI WITH/WO TBI  Result Date: 12/03/2022  LOWER EXTREMITY DOPPLER STUDY Patient Name:  PESACH HORBAL  Date of Exam:   11/27/2022 Medical Rec #: [id number]        Accession #:    [id number] Date of Birth: 10/19/1950        Patient Gender: M Patient Age:   35 years Exam Location:  [facility] Procedure:      VAS [place] ABI WITH/WO TBI Referring Phys: --------------------------------------------------------------------------------  Indications: Rest pain.  Performing Technologist: Concha Norway RVT  Examination Guidelines: A complete evaluation includes at minimum, Doppler waveform signals and systolic blood pressure reading at the level of bilateral brachial, anterior tibial, and posterior tibial arteries, when vessel segments are accessible. Bilateral testing is considered an integral part of a complete examination. Photoelectric Plethysmograph (PPG) waveforms and toe systolic pressure readings are included as required and additional duplex testing as needed. Limited examinations for reoccurring indications may be performed as noted.  ABI Findings: +---------+------------------+-----+---------+--------+ Right    Rt Pressure (mmHg)IndexWaveform Comment  +---------+------------------+-----+---------+--------+ Brachial 118                                      +---------+------------------+-----+---------+--------+ ATA      138                1.15 triphasic         +---------+------------------+-----+---------+--------+ PTA      151               1.26 triphasic         +---------+------------------+-----+---------+--------+ Great Toe145               1.21 Normal            +---------+------------------+-----+---------+--------+ +---------+------------------+-----+---------+-------+ Left     Lt Pressure (mmHg)IndexWaveform Comment +---------+------------------+-----+---------+-------+ Brachial 120                                     +---------+------------------+-----+---------+-------+ ATA      142               1.18 triphasic        +---------+------------------+-----+---------+-------+ PTA      140               1.17 triphasic        +---------+------------------+-----+---------+-------+ Great Toe142               1.18 Normal           +---------+------------------+-----+---------+-------+  Summary: Right: Resting right ankle-brachial index is within normal  range. The right toe-brachial index is normal. Left: Resting left ankle-brachial index is within normal range. The left toe-brachial index is normal. *See table(s) above for measurements and observations.  Electronically signed by Leotis Pain MD on 12/03/2022 at 8:21:29 AM.    Final     Assessment/Plan Hypertension blood pressure control important in reducing the progression of atherosclerotic disease. On appropriate oral medications.  No hemodynamically significant renal artery stenosis identified in either kidney with normal kidney length seen bilaterally.  This would suggest this is not renovascular hypertension.     Hyperlipidemia lipid control important in reducing the progression of atherosclerotic disease. Continue statin therapy  Carotid stenosis, left Follow-up carotid duplex scheduled in the coming months.  At last check, his carotid stent was widely patent.    Leotis Pain, MD  12/25/2022 10:06 AM    This note was created with  Dragon medical transcription system.  Any errors from dictation are purely unintentional

## 2022-12-25 NOTE — Telephone Encounter (Signed)
Pt stopped in .  Pt want to arrange to get the balloon on the left side.Notes in file. As you discussed with pt. Pt is starting to urinate blood not constantly.  Please advise [phone]

## 2022-12-26 NOTE — Telephone Encounter (Signed)
I don't see any mention of a balloon procedure in previous note. Can you please advise on next steps?

## 2022-12-26 NOTE — Telephone Encounter (Signed)
Called pt to inquire about his symptoms and clarify what "balloon" he is speaking of. Patient states that he is concerned the balloon in his bladder and that it may turn into cancer. 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 Patient states he is having 2 episodes of gross hematuria per week. Patient scheduled for f/u appointment.

## 2023-01-17 ENCOUNTER — Telehealth: Payer: Self-pay

## 2023-01-17 ENCOUNTER — Encounter: Payer: Self-pay | Admitting: Urology

## 2023-01-17 ENCOUNTER — Ambulatory Visit: Payer: Medicare Other | Admitting: Urology

## 2023-01-17 VITALS — BP 115/81 | HR 50 | Ht 76.0 in | Wt 231.0 lb

## 2023-01-17 DIAGNOSIS — N401 Enlarged prostate with lower urinary tract symptoms: Secondary | ICD-10-CM

## 2023-01-17 DIAGNOSIS — R310 Gross hematuria: Secondary | ICD-10-CM | POA: Diagnosis not present

## 2023-01-17 DIAGNOSIS — N138 Other obstructive and reflux uropathy: Secondary | ICD-10-CM | POA: Diagnosis not present

## 2023-01-17 LAB — URINALYSIS, COMPLETE
Bilirubin, UA: NEGATIVE
Glucose, UA: NEGATIVE
Ketones, UA: NEGATIVE
Leukocytes,UA: NEGATIVE
Nitrite, UA: NEGATIVE
Protein,UA: NEGATIVE
RBC, UA: NEGATIVE
Specific Gravity, UA: 1.02 (ref 1.005–1.030)
Urobilinogen, Ur: 0.2 mg/dL (ref 0.2–1.0)
pH, UA: 6 (ref 5.0–7.5)

## 2023-01-17 LAB — MICROSCOPIC EXAMINATION

## 2023-01-17 LAB — BLADDER SCAN AMB NON-IMAGING

## 2023-01-17 NOTE — Progress Notes (Signed)
   01/17/2023 8:49 AM   Randy Shaffer 10-30-1950 [id number]  Reason for visit: Follow up BPH status post HOLEP  HPI: 72 year old male with BPH and obstructive symptoms refractory to medical management, PVRs greater than , who opted for HOLEP.  He underwent uncomplicated HOLEP on 08/03/2022 with removal of 35 g of benign prostate tissue. Noted to have a large wide mouthed posterior bladder diverticulum at time of surgery.  Overall, he is doing very well.  He reports a much stronger stream, only having nocturia 0-1 time overnight, denies any significant incontinence.  Bladder scan today normal at 99 ml.  He did have 1 elevated PVR after HOLEP of 300 mL, and on the bladder scan could appreciate the bilobed bladder consistent with the large posterior bladder diverticulum.  He has been leaning forward to void which has helped.  He made an appointment today because he has had a few episodes of dark urine that he feels could be blood.  He has a sister with bladder cancer and is concerned.  He also has some vague abdominal and low back pain of unclear etiology.  Again denies any significant urinary symptoms today.  Urinalysis is pending.  He drinks 8 cups of coffee in the morning, remains on anticoagulation with Plavix.  He has had lots of issues with dizziness and blood pressure that are being managed by multiple outside providers.  Follow-up urinalysis today, if infected will treat with antibiotics.  If UA benign or microscopic hematuria will schedule cystoscopy per patient request.  Reassurance was provided that there were no suspicious bladder lesions at time of HOLEP in November 2023.   Sondra Come, MD  [facility] [address], [address] [place], [place] [address] [phone]

## 2023-01-17 NOTE — Patient Instructions (Signed)

## 2023-01-17 NOTE — Telephone Encounter (Signed)
Called pt informed him of the information below. Pt voiced understanding. States he would like to monitor symptoms for now and does not wish to schedule cysto at this time.

## 2023-01-17 NOTE — Telephone Encounter (Signed)
-----   Message from Sondra Come, MD sent at 01/17/2023 12:46 PM EDT ----- Urinalysis completely normal with no infection, okay to set up clinic cystoscopy per patient preference  Legrand Rams, MD 01/17/2023

## 2023-01-23 LAB — CULTURE, URINE COMPREHENSIVE

## 2023-01-31 ENCOUNTER — Encounter: Payer: Self-pay | Admitting: *Deleted

## 2023-02-01 ENCOUNTER — Ambulatory Visit: Payer: Medicare Other | Admitting: Anesthesiology

## 2023-02-01 ENCOUNTER — Encounter
Admission: RE | Disposition: A | Discharge status: Home / Self Care | Payer: Self-pay | Source: Home / Self Care | Attending: Gastroenterology

## 2023-02-01 ENCOUNTER — Ambulatory Visit
Admission: RE | Admit: 2023-02-01 | Discharge: 2023-02-01 | Disposition: A | Discharge status: Home / Self Care | Payer: Medicare Other | Attending: Gastroenterology | Admitting: Gastroenterology

## 2023-02-01 DIAGNOSIS — Z7901 Long term (current) use of anticoagulants: Secondary | ICD-10-CM | POA: Insufficient documentation

## 2023-02-01 DIAGNOSIS — K640 First degree hemorrhoids: Secondary | ICD-10-CM | POA: Diagnosis not present

## 2023-02-01 DIAGNOSIS — G4730 Sleep apnea, unspecified: Secondary | ICD-10-CM | POA: Diagnosis not present

## 2023-02-01 DIAGNOSIS — K5730 Diverticulosis of large intestine without perforation or abscess without bleeding: Secondary | ICD-10-CM | POA: Diagnosis not present

## 2023-02-01 DIAGNOSIS — I10 Essential (primary) hypertension: Secondary | ICD-10-CM | POA: Diagnosis not present

## 2023-02-01 DIAGNOSIS — G4700 Insomnia, unspecified: Secondary | ICD-10-CM | POA: Insufficient documentation

## 2023-02-01 DIAGNOSIS — Z955 Presence of coronary angioplasty implant and graft: Secondary | ICD-10-CM | POA: Diagnosis not present

## 2023-02-01 DIAGNOSIS — F32A Depression, unspecified: Secondary | ICD-10-CM | POA: Insufficient documentation

## 2023-02-01 DIAGNOSIS — E1151 Type 2 diabetes mellitus with diabetic peripheral angiopathy without gangrene: Secondary | ICD-10-CM | POA: Insufficient documentation

## 2023-02-01 DIAGNOSIS — I2510 Atherosclerotic heart disease of native coronary artery without angina pectoris: Secondary | ICD-10-CM | POA: Diagnosis not present

## 2023-02-01 DIAGNOSIS — H9191 Unspecified hearing loss, right ear: Secondary | ICD-10-CM | POA: Insufficient documentation

## 2023-02-01 DIAGNOSIS — Z7982 Long term (current) use of aspirin: Secondary | ICD-10-CM | POA: Diagnosis not present

## 2023-02-01 DIAGNOSIS — Z89011 Acquired absence of right thumb: Secondary | ICD-10-CM | POA: Insufficient documentation

## 2023-02-01 DIAGNOSIS — E785 Hyperlipidemia, unspecified: Secondary | ICD-10-CM | POA: Diagnosis not present

## 2023-02-01 DIAGNOSIS — N400 Enlarged prostate without lower urinary tract symptoms: Secondary | ICD-10-CM | POA: Diagnosis not present

## 2023-02-01 DIAGNOSIS — Z1211 Encounter for screening for malignant neoplasm of colon: Secondary | ICD-10-CM | POA: Insufficient documentation

## 2023-02-01 HISTORY — PX: COLONOSCOPY WITH PROPOFOL: SHX5780

## 2023-02-01 LAB — GLUCOSE, CAPILLARY: Glucose-Capillary: 148 mg/dL — ABNORMAL HIGH (ref 70–99)

## 2023-02-01 SURGERY — COLONOSCOPY WITH PROPOFOL
Anesthesia: General

## 2023-02-01 MED ORDER — SODIUM CHLORIDE 0.9 % IV SOLN
INTRAVENOUS | Status: DC
  Administered 2023-02-01: 20 mL/h via INTRAVENOUS

## 2023-02-01 MED ORDER — LIDOCAINE HCL (PF) 2 % IJ SOLN
INTRAMUSCULAR | Status: AC
  Filled 2023-02-01: qty 5

## 2023-02-01 MED ORDER — DEXMEDETOMIDINE HCL IN NACL 200 MCG/50ML IV SOLN
INTRAVENOUS | Status: DC | PRN
  Administered 2023-02-01: 8 ug via INTRAVENOUS

## 2023-02-01 MED ORDER — LIDOCAINE HCL (CARDIAC) PF 100 MG/5ML IV SOSY
PREFILLED_SYRINGE | INTRAVENOUS | Status: DC | PRN
  Administered 2023-02-01: 40 mg via INTRAVENOUS

## 2023-02-01 MED ORDER — EPHEDRINE SULFATE (PRESSORS) 50 MG/ML IJ SOLN
INTRAMUSCULAR | Status: DC | PRN
  Administered 2023-02-01: 15 mg via INTRAVENOUS

## 2023-02-01 MED ORDER — PHENYLEPHRINE HCL (PRESSORS) 10 MG/ML IV SOLN
INTRAVENOUS | Status: DC | PRN
  Administered 2023-02-01 (×2): 80 ug via INTRAVENOUS

## 2023-02-01 MED ORDER — PROPOFOL 1000 MG/100ML IV EMUL
INTRAVENOUS | Status: AC
  Filled 2023-02-01: qty 100

## 2023-02-01 MED ORDER — PROPOFOL 500 MG/50ML IV EMUL
INTRAVENOUS | Status: DC | PRN
  Administered 2023-02-01: 150 ug/kg/min via INTRAVENOUS

## 2023-02-01 MED ORDER — PROPOFOL 10 MG/ML IV BOLUS
INTRAVENOUS | Status: DC | PRN
  Administered 2023-02-01: 80 mg via INTRAVENOUS

## 2023-02-01 MED ORDER — GLYCOPYRROLATE 0.2 MG/ML IJ SOLN
INTRAMUSCULAR | Status: DC | PRN
  Administered 2023-02-01: .2 mg via INTRAVENOUS

## 2023-02-01 NOTE — Op Note (Signed)
[facility] Patient Name: Randy Shaffer Procedure Date: 02/01/2023 10:20 AM MRN: [id number] Account #: [id number] Date of Birth: [birth year] Admit Type: Outpatient Age: 72 Room: [facility] ENDO ROOM 3 Gender: Male Note Status: Finalized Instrument Name: Colonoscope [id number] Procedure:             Colonoscopy Indications:           Screening for colorectal malignant neoplasm Providers:             Eather Colas MD, MD Referring MD:          Rhona Leavens. Burnett Sheng, MD (Referring MD) Medicines:             Monitored Anesthesia Care Complications:         No immediate complications. Procedure:             Pre-Anesthesia Assessment:                        - Prior to the procedure, a History and Physical was                         performed, and patient medications and allergies were                         reviewed. The patient is competent. The risks and                         benefits of the procedure and the sedation options and                         risks were discussed with the patient. All questions                         were answered and informed consent was obtained.                         Patient identification and proposed procedure were                         verified by the physician, the nurse, the                         anesthesiologist, the anesthetist and the technician                         in the endoscopy suite. Mental Status Examination:                         alert and oriented. Airway Examination: normal                         oropharyngeal airway and neck mobility. Respiratory                         Examination: clear to auscultation. CV Examination:                         normal. Prophylactic Antibiotics: The patient does not  require prophylactic antibiotics. Prior                         Anticoagulants: The patient has taken Plavix                         (clopidogrel), last dose was 7 days prior to                          procedure. ASA Grade Assessment: III - A patient with                         severe systemic disease. After reviewing the risks and                         benefits, the patient was deemed in satisfactory                         condition to undergo the procedure. The anesthesia                         plan was to use monitored anesthesia care (MAC).                         Immediately prior to administration of medications,                         the patient was re-assessed for adequacy to receive                         sedatives. The heart rate, respiratory rate, oxygen                         saturations, blood pressure, adequacy of pulmonary                         ventilation, and response to care were monitored                         throughout the procedure. The physical status of the                         patient was re-assessed after the procedure.                        After obtaining informed consent, the colonoscope was                         passed under direct vision. Throughout the procedure,                         the patient's blood pressure, pulse, and oxygen                         saturations were monitored continuously. The                         Colonoscope was introduced through the anus and  advanced to the the terminal ileum. The colonoscopy                         was performed without difficulty. The patient                         tolerated the procedure well. The quality of the bowel                         preparation was excellent. The terminal ileum,                         ileocecal valve, appendiceal orifice, and rectum were                         photographed. Findings:      The perianal and digital rectal examinations were normal.      The terminal ileum appeared normal.      Scattered large-mouthed and small-mouthed diverticula were found in the       sigmoid colon, descending colon, transverse  colon and ascending colon.      Internal hemorrhoids were found during retroflexion. The hemorrhoids       were Grade I (internal hemorrhoids that do not prolapse).      The exam was otherwise without abnormality on direct and retroflexion       views. Impression:            - The examined portion of the ileum was normal.                        - Diverticulosis in the sigmoid colon, in the                         descending colon, in the transverse colon and in the                         ascending colon.                        - Internal hemorrhoids.                        - The examination was otherwise normal on direct and                         retroflexion views.                        - No specimens collected. Recommendation:        - Discharge patient to home.                        - Resume previous diet.                        - Continue present medications.                        - Repeat colonoscopy is not recommended due to current  age (64 years or older) for screening purposes.                        - Return to referring physician as previously                         scheduled. Procedure Code(s):     --- Professional ---                        J1914, Colorectal cancer screening; colonoscopy on                         individual not meeting criteria for high risk Diagnosis Code(s):     --- Professional ---                        Z12.11, Encounter for screening for malignant neoplasm                         of colon                        K64.0, First degree hemorrhoids                        K57.30, Diverticulosis of large intestine without                         perforation or abscess without bleeding CPT copyright 2022 American Medical Association. All rights reserved. The codes documented in this report are preliminary and upon coder review may  be revised to meet current compliance requirements. Eather Colas MD, MD 02/01/2023 10:55:28  AM Number of Addenda: 0 Note Initiated On: 02/01/2023 10:20 AM Scope Withdrawal Time: 0 hours 8 minutes 7 seconds  Total Procedure Duration: 0 hours 11 minutes 44 seconds  Estimated Blood Loss:  Estimated blood loss: none.      [facility]

## 2023-02-01 NOTE — Interval H&P Note (Signed)
History and Physical Interval Note:  02/01/2023 10:29 AM  Randy Shaffer  has presented today for surgery, with the diagnosis of Colon Cancer Screening.  The various methods of treatment have been discussed with the patient and family. After consideration of risks, benefits and other options for treatment, the patient has consented to  Procedure(s): COLONOSCOPY WITH PROPOFOL (N/A) as a surgical intervention.  The patient's history has been reviewed, patient examined, no change in status, stable for surgery.  I have reviewed the patient's chart and labs.  Questions were answered to the patient's satisfaction.     Regis Bill  Ok to proceed with colonoscopy

## 2023-02-01 NOTE — Anesthesia Preprocedure Evaluation (Signed)
Anesthesia Evaluation  Patient identified by MRN, date of birth, ID band Patient awake    Reviewed: Allergy & Precautions, NPO status , Patient's Chart, lab work & pertinent test results  History of Anesthesia Complications (+) PONV and history of anesthetic complications  Airway Mallampati: III  TM Distance: >3 FB Neck ROM: full    Dental  (+) Chipped   Pulmonary neg shortness of breath, sleep apnea    Pulmonary exam normal        Cardiovascular Exercise Tolerance: Good hypertension, (-) angina + CAD, + Cardiac Stents, + Peripheral Vascular Disease and + DOE  Normal cardiovascular exam     Neuro/Psych  PSYCHIATRIC DISORDERS      negative neurological ROS     GI/Hepatic negative GI ROS, Neg liver ROS,neg GERD  ,,  Endo/Other  diabetes, Type 2    Renal/GU negative Renal ROS  negative genitourinary   Musculoskeletal   Abdominal   Peds  Hematology negative hematology ROS (+)   Anesthesia Other Findings Past Medical History: No date: Anginal pain (HCC) No date: Aortic atherosclerosis (HCC) No date: BPH (benign prostatic hyperplasia) 12/13/2021: CAD (coronary artery disease)     Comment:  a.) R/LHC 12/13/2021: EF 55-65%, 25% dLM-oLAD, 25% pLAD,              50% m-dLAD; mPA 20, mPCWP 10, Ao sat 96, PA sat 71, CO               5.7. No date: Cardiac murmur No date: Carotid artery disease (HCC)     Comment:  a.) CTA neck 03/02/2022: 70% LICA; b.) PTA 04/19/2022:               80% LICA --> POBA performed  followed by placement of a 9              mm prox, 7mm distal, 4 cm long exact stent placed; 10-15%              residual stenosis; c.) carotid doppler 05/22/2022: 1-39%               BICA No date: Complication of anesthesia     Comment:  a.) PONV No date: Depression No date: Diet-controlled type 2 diabetes mellitus (HCC) 09/05/2011: Diverticulosis No date: DOE (dyspnea on exertion) No date: History of bilateral  cataract extraction No date: HLD (hyperlipidemia) No date: Hypertension No date: Insomnia     Comment:  a.) takes trazodone PRN No date: Long term current use of antithrombotics/antiplatelets     Comment:  a.) on daily DAPT (ASA + clopidogrel) No date: 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 No date: OA (osteoarthritis) of knee No date: PONV (postoperative nausea and vomiting)     Comment:  SEVERE No date: Sleep apnea     Comment:  a.) unable to tolerate nocturnal PAP therapy  Past Surgical History: No date: AMPUTATION FINGER / THUMB; Right     Comment:  THUMB PLUS 3 RECONSTRUCTIVE SURGERIES 04/16/2022: CAROTID PTA/STENT INTERVENTION; Left     Comment:  Procedure: CAROTID PTA/STENT INTERVENTION;  Surgeon:               Annice Needy, MD;  Location: [facility] INVASIVE CV LAB;                Service: Cardiovascular;  Laterality: Left; No date: CORONARY ANGIOPLASTY No date: HEAD LACERATION REPAIR 09/24/1998: HERNIA REPAIR     Comment:  ONE AS INFANT No date: HIP NERVE RELEASE 08/03/2022: HOLEP-LASER  ENUCLEATION OF THE PROSTATE WITH  MORCELLATION; N/A     Comment:  Procedure: HOLEP-LASER ENUCLEATION OF THE PROSTATE WITH               MORCELLATION;  Surgeon: Sondra Come, MD;  Location:               [facility] ORS;  Service: Urology;  Laterality: N/A; 09/24/2001: KNEE ARTHROSCOPY; Right 02/23/2015: KNEE ARTHROSCOPY; Right     Comment:  Procedure: ARTHROSCOPY KNEE WITH DEBRIDEMENT PARTIAL               MEDIAL MENISECTOMY;  Surgeon: Christena Flake, MD;                Location: [facility];  Service: Orthopedics;                Laterality: Right; No date: PHOTOREFRACTIVE KERATOTOMY 12/13/2021: RIGHT/LEFT HEART CATH AND CORONARY ANGIOGRAPHY; N/A     Comment:  Procedure: RIGHT/LEFT HEART CATH AND CORONARY               ANGIOGRAPHY;  Surgeon: Alwyn Pea, MD;  Location:              [facility] INVASIVE CV LAB;  Service: Cardiovascular;                Laterality: N/A;  BMI    Body Mass Index:  27.34 kg/m      Reproductive/Obstetrics negative OB ROS                             Anesthesia Physical Anesthesia Plan  ASA: 3  Anesthesia Plan: General   Post-op Pain Management:    Induction: Intravenous  PONV Risk Score and Plan: Propofol infusion and TIVA  Airway Management Planned: Natural Airway and Nasal Cannula  Additional Equipment:   Intra-op Plan:   Post-operative Plan:   Informed Consent: I have reviewed the patients History and Physical, chart, labs and discussed the procedure including the risks, benefits and alternatives for the proposed anesthesia with the patient or authorized representative who has indicated his/her understanding and acceptance.     Dental Advisory Given  Plan Discussed with: Anesthesiologist, CRNA and Surgeon  Anesthesia Plan Comments: (Patient consented for risks of anesthesia including but not limited to:  - adverse reactions to medications - risk of airway placement if required - damage to eyes, teeth, lips or other oral mucosa - nerve damage due to positioning  - sore throat or hoarseness - Damage to heart, brain, nerves, lungs, other parts of body or loss of life  Patient voiced understanding.)       Anesthesia Quick Evaluation

## 2023-02-01 NOTE — Transfer of Care (Signed)
Immediate Anesthesia Transfer of Care Note  Patient: Randy Shaffer  Procedure(s) Performed: Procedure(s): COLONOSCOPY WITH PROPOFOL (N/A)  Patient Location: PACU and Endoscopy Unit  Anesthesia Type:General  Level of Consciousness: sedated  Airway & Oxygen Therapy: Patient Spontanous Breathing and Patient connected to nasal cannula oxygen  Post-op Assessment: Report given to RN and Post -op Vital signs reviewed and stable  Post vital signs: Reviewed and stable  Last Vitals:  Vitals:   02/01/23 0914 02/01/23 1052  BP: (!) 147/107   Pulse: 61 64  Resp: 20 16  Temp: (!) 35.7 C (!) 36.4 C  SpO2: 100% 97%    Complications: No apparent anesthesia complications

## 2023-02-01 NOTE — Anesthesia Postprocedure Evaluation (Signed)
Anesthesia Post Note  Patient: Randy Shaffer  Procedure(s) Performed: COLONOSCOPY WITH PROPOFOL  Patient location during evaluation: Endoscopy Anesthesia Type: General Level of consciousness: awake and alert Pain management: pain level controlled Vital Signs Assessment: post-procedure vital signs reviewed and stable Respiratory status: spontaneous breathing, nonlabored ventilation, respiratory function stable and patient connected to nasal cannula oxygen Cardiovascular status: blood pressure returned to baseline and stable Postop Assessment: no apparent nausea or vomiting Anesthetic complications: no   No notable events documented.   Last Vitals:  Vitals:   02/01/23 0914 02/01/23 1052  BP: (!) 147/107 (!) 86/46  Pulse: 61 64  Resp: 20 16  Temp: (!) 35.7 C (!) 36.4 C  SpO2: 100% 97%    Last Pain:  Vitals:   02/01/23 1114  TempSrc:   PainSc: 0-No pain                 Cleda Mccreedy Richey Doolittle

## 2023-02-01 NOTE — H&P (Signed)
Outpatient short stay form Pre-procedure 02/01/2023  Regis Bill, MD  Primary Physician: Jerl Mina, MD  Reason for visit:  Screening  History of present illness:    72 y/o gentleman with history of CAD, DM II, and hypertension here for screening colonoscopy. Had normal colonoscopy in 2012. Takes plavix with last dose in 2012. No significant abdominal surgeries. No family history of GI malignancies.    Current Facility-Administered Medications:    0.9 %  sodium chloride infusion, , Intravenous, Continuous, Honor Fairbank, Rossie Muskrat, MD, Last Rate: 20 mL/hr at 02/01/23 0933, 20 mL/hr at 02/01/23 0933  Medications Prior to Admission  Medication Sig Dispense Refill Last Dose   albuterol (VENTOLIN HFA) 108 (90 Base) MCG/ACT inhaler Inhale into the lungs.   02/01/2023 at 0400   aspirin EC 81 MG tablet Take 81 mg by mouth daily. Swallow whole.   02/01/2023 at 0400   atorvastatin (LIPITOR) 40 MG tablet Take 20 mg by mouth in the morning.   02/01/2023 at 0400   clopidogrel (PLAVIX) 75 MG tablet TAKE 1 TABLET BY MOUTH EVERY DAY 90 tablet 2 Past Week   FLUoxetine (PROZAC) 20 MG capsule Take 40 mg by mouth every morning.   02/01/2023 at 0400   isosorbide mononitrate (IMDUR) 30 MG 24 hr tablet Take 30 mg by mouth daily.   02/01/2023 at 0400   metoprolol succinate (TOPROL-XL) 25 MG 24 hr tablet Take 12.5 mg by mouth in the morning and at bedtime.   02/01/2023 at 0400   PARoxetine (PAXIL) 20 MG tablet TAKE 1/2 TAB DAILY FOR A WEEK THEN INCREASE TO 1 TAB DAILY AND CONTINUE THAT DOSE   02/01/2023 at 0400   tamsulosin (FLOMAX) 0.4 MG CAPS capsule Take 0.4 mg by mouth daily.   02/01/2023 at 0400   traZODone (DESYREL) 50 MG tablet TAKE 1.5-2 TABS BY MOUTH AT NIGHT FOR SLEEP   02/01/2023     Allergies  Allergen Reactions   Thiopental Sodium [Thiopental] Nausea And Vomiting    SEVERE VOMITING     Past Medical History:  Diagnosis Date   Anginal pain (HCC)    Aortic atherosclerosis (HCC)    BPH  (benign prostatic hyperplasia)    CAD (coronary artery disease) 12/13/2021   a.) R/LHC 12/13/2021: EF 55-65%, 25% dLM-oLAD, 25% pLAD, 50% m-dLAD; mPA 20, mPCWP 10, Ao sat 96, PA sat 71, CO 5.7.   Cardiac murmur    Carotid artery disease (HCC)    a.) CTA neck 03/02/2022: 70% LICA; b.) PTA 04/19/2022: 80% LICA --> POBA performed  followed by placement of a 9 mm prox, 7mm distal, 4 cm long exact stent placed; 10-15% residual stenosis; c.) carotid doppler 05/22/2022: 1-39% BICA   Complication of anesthesia    a.) PONV   Depression    Diet-controlled type 2 diabetes mellitus (HCC)    Diverticulosis 09/05/2011   DOE (dyspnea on exertion)    History of bilateral cataract extraction    HLD (hyperlipidemia)    Hypertension    Insomnia    a.) takes trazodone PRN   Long term current use of antithrombotics/antiplatelets    a.) on daily DAPT (ASA + clopidogrel)   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    Physical Exam  Gen: Alert, oriented. Appears  stated age.  HEENT: PERRLA. Lungs: No respiratory distress CV: RRR Abd: soft, benign, no masses Ext: No edema    Planned procedures: Proceed with colonoscopy. The patient understands the nature of the planned procedure, indications, risks, alternatives and potential complications including but not limited to bleeding, infection, perforation, damage to internal organs and possible oversedation/side effects from anesthesia. The patient agrees and gives consent to proceed.  Please refer to procedure notes for findings, recommendations and patient disposition/instructions.     Lesly Rubenstein, MD [facility]

## 2023-02-01 NOTE — Anesthesia Procedure Notes (Signed)
Date/Time: 02/01/2023 10:31 AM  Performed by: Stormy Fabian, CRNAPre-anesthesia Checklist: Patient identified, Emergency Drugs available, Suction available and Patient being monitored Patient Re-evaluated:Patient Re-evaluated prior to induction Oxygen Delivery Method: Nasal cannula Induction Type: IV induction Dental Injury: Teeth and Oropharynx as per pre-operative assessment  Comments: Nasal cannula with etCO2 monitoring

## 2023-02-04 ENCOUNTER — Encounter: Payer: Self-pay | Admitting: Gastroenterology

## 2023-02-19 ENCOUNTER — Encounter (INDEPENDENT_AMBULATORY_CARE_PROVIDER_SITE_OTHER): Payer: Medicare Other

## 2023-02-19 ENCOUNTER — Ambulatory Visit (INDEPENDENT_AMBULATORY_CARE_PROVIDER_SITE_OTHER): Payer: Medicare Other | Admitting: Vascular Surgery

## 2023-04-03 ENCOUNTER — Encounter: Payer: Self-pay | Admitting: Emergency Medicine

## 2023-04-03 ENCOUNTER — Emergency Department (EMERGENCY_DEPARTMENT_HOSPITAL)
Admission: EM | Admit: 2023-04-03 | Discharge: 2023-04-05 | Disposition: A | Discharge status: Home / Self Care | Payer: Medicare Other | Source: Home / Self Care | Attending: Emergency Medicine | Admitting: Emergency Medicine

## 2023-04-03 ENCOUNTER — Other Ambulatory Visit: Payer: Self-pay

## 2023-04-03 DIAGNOSIS — R4689 Other symptoms and signs involving appearance and behavior: Secondary | ICD-10-CM | POA: Insufficient documentation

## 2023-04-03 DIAGNOSIS — I10 Essential (primary) hypertension: Secondary | ICD-10-CM | POA: Insufficient documentation

## 2023-04-03 DIAGNOSIS — R45851 Suicidal ideations: Secondary | ICD-10-CM | POA: Insufficient documentation

## 2023-04-03 DIAGNOSIS — E119 Type 2 diabetes mellitus without complications: Secondary | ICD-10-CM | POA: Insufficient documentation

## 2023-04-03 DIAGNOSIS — F69 Unspecified disorder of adult personality and behavior: Secondary | ICD-10-CM

## 2023-04-03 DIAGNOSIS — I2510 Atherosclerotic heart disease of native coronary artery without angina pectoris: Secondary | ICD-10-CM | POA: Insufficient documentation

## 2023-04-03 LAB — CBC
HCT: 49.6 % (ref 39.0–52.0)
Hemoglobin: 16.9 g/dL (ref 13.0–17.0)
MCH: 29.6 pg (ref 26.0–34.0)
MCHC: 34.1 g/dL (ref 30.0–36.0)
MCV: 87 fL (ref 80.0–100.0)
Platelets: 208 10*3/uL (ref 150–400)
RBC: 5.7 MIL/uL (ref 4.22–5.81)
RDW: 12 % (ref 11.5–15.5)
WBC: 5.8 10*3/uL (ref 4.0–10.5)
nRBC: 0 % (ref 0.0–0.2)

## 2023-04-03 LAB — COMPREHENSIVE METABOLIC PANEL
ALT: 28 U/L (ref 0–44)
AST: 26 U/L (ref 15–41)
Albumin: 4.7 g/dL (ref 3.5–5.0)
Alkaline Phosphatase: 68 U/L (ref 38–126)
Anion gap: 11 (ref 5–15)
BUN: 21 mg/dL (ref 8–23)
CO2: 23 mmol/L (ref 22–32)
Calcium: 9.1 mg/dL (ref 8.9–10.3)
Chloride: 105 mmol/L (ref 98–111)
Creatinine, Ser: 1.14 mg/dL (ref 0.61–1.24)
GFR, Estimated: >60 mL/min (ref >60)
Glucose, Bld: 155 mg/dL — ABNORMAL HIGH (ref 70–99)
Potassium: 4 mmol/L (ref 3.5–5.1)
Sodium: 139 mmol/L (ref 135–145)
Total Bilirubin: 1.2 mg/dL (ref 0.3–1.2)
Total Protein: 7.3 g/dL (ref 6.5–8.1)

## 2023-04-03 LAB — SALICYLATE LEVEL: Salicylate Lvl: <7 mg/dL — ABNORMAL LOW (ref 7.0–30.0)

## 2023-04-03 LAB — ETHANOL: Alcohol, Ethyl (B): <10 mg/dL (ref <10)

## 2023-04-03 LAB — ACETAMINOPHEN LEVEL: Acetaminophen (Tylenol), Serum: 13 ug/mL (ref 10–30)

## 2023-04-03 MED ORDER — CLOPIDOGREL BISULFATE 75 MG PO TABS: 75.0000 mg | ORAL_TABLET | Freq: Every day | ORAL | Status: DC

## 2023-04-03 MED ORDER — ASPIRIN 81 MG PO TBEC: 81.0000 mg | DELAYED_RELEASE_TABLET | Freq: Every day | ORAL | Status: DC

## 2023-04-03 MED ORDER — METOPROLOL SUCCINATE ER 25 MG PO TB24: 12.5000 mg | ORAL_TABLET | Freq: Every day | ORAL | Status: DC

## 2023-04-03 MED ORDER — ATORVASTATIN CALCIUM 20 MG PO TABS: 20.0000 mg | ORAL_TABLET | Freq: Every morning | ORAL | Status: DC

## 2023-04-03 MED ORDER — ATORVASTATIN CALCIUM 20 MG PO TABS: 20.0000 mg | ORAL_TABLET | Freq: Every day | ORAL | Status: DC
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
## 2023-04-03 NOTE — ED Notes (Signed)
Patient is sitting in the chair in his room in a tripod position with his head in his hands.  He is calm and somewhat cooperative.  Patient does not wish to submit to further blood draws at this time.  He may allow vital signs to be taken later.

## 2023-04-03 NOTE — ED Notes (Addendum)
Patient refuses blood draw, and medication. MD Larinda Buttery notified
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
## 2023-04-03 NOTE — ED Notes (Signed)
This RN attempted to have patient dress out. Pt informed if we could get him changed we could potentially move him into the bhu but in order to do that he needed to be changed out into hospital scrubs. Pt states "well I guess I'll just be here forever then".

## 2023-04-03 NOTE — ED Notes (Addendum)
Pt dressed out by [employer] and [employer].  Pt Belongings: Teal T shirt Gray shoes Pair of white socks Green boxers [employer] belt

## 2023-04-03 NOTE — ED Notes (Signed)
Patient cooperative, but agitated. This Rn asked patient why he is here. Pt stated that he is here because "people won't leave him alone, he went out into the woods because he wanted to be left alone." Pt also stated that "When I want to be left alone, I go into the woods for days at a time. The next time I go into the woods, It will be for good."

## 2023-04-03 NOTE — ED Notes (Signed)
Patient refuses to take medication.  He says he will take it when he gets home, but not here.

## 2023-04-03 NOTE — ED Notes (Signed)
Patient asked to change into hospital scrubs. Patient states "you will have to out me asleep to take me out of my clothes and when I wake up I will raise hell."

## 2023-04-03 NOTE — ED Notes (Signed)
IVC PENDING  CONSULT ?

## 2023-04-04 DIAGNOSIS — R45851 Suicidal ideations: Secondary | ICD-10-CM | POA: Diagnosis not present

## 2023-04-04 DIAGNOSIS — F69 Unspecified disorder of adult personality and behavior: Secondary | ICD-10-CM

## 2023-04-04 DIAGNOSIS — R4689 Other symptoms and signs involving appearance and behavior: Secondary | ICD-10-CM

## 2023-04-04 MED ORDER — TRAZODONE HCL 50 MG PO TABS: 50.0000 mg | ORAL_TABLET | Freq: Every evening | ORAL | Status: DC | PRN

## 2023-04-04 MED ORDER — PAROXETINE HCL 20 MG PO TABS
20.0000 mg | ORAL_TABLET | Freq: Every day | ORAL | Status: DC
  Filled 2023-04-04 (×2): qty 1

## 2023-04-04 MED ORDER — ALBUTEROL SULFATE (2.5 MG/3ML) 0.083% IN NEBU: 3.0000 mL | INHALATION_SOLUTION | Freq: Four times a day (QID) | RESPIRATORY_TRACT | Status: DC | PRN

## 2023-04-04 MED ORDER — ISOSORBIDE MONONITRATE ER 60 MG PO TB24: 30.0000 mg | ORAL_TABLET | Freq: Every day | ORAL | Status: DC

## 2023-04-04 MED ORDER — ALBUTEROL SULFATE (2.5 MG/3ML) 0.083% IN NEBU: 3.0000 mL | INHALATION_SOLUTION | Freq: Four times a day (QID) | RESPIRATORY_TRACT | Status: DC

## 2023-04-04 NOTE — ED Notes (Signed)
Patient is still uncooperative.  He will not allow vital signs to be taken, nor will he take mediation.  Patient wants to go home.

## 2023-04-04 NOTE — ED Notes (Signed)
Patient became very irate during his talk with the NP.  When call ended, the patient stated to this nurse that under no circumstances was that computer screen to come back into his room.  Patient stated that he was going to "bust this place all to fucking hell".

## 2023-04-04 NOTE — Group Note (Signed)
Recreation Therapy Group Note   Group Topic:Healthy Support Systems  Group Date: 04/04/2023 Start Time:  2:00 PM End Time:  2:45 PM Facilitators: Rosina Lowenstein, LRT, CTRS Location: Courtyard  Group Description: Emotional Check in. Patient sat and talked with LRT about how they are doing and whatever else is on their mind. LRT provided active listening, reassurance and encouragement. Group was held outside in the courtyard to get fresh air and sunlight. Music was playing in the background.    Goal Area(s) Addressed: Patient will engage in conversation with LRT. Patient will communicate their wants, needs, or questions.  Patient will practice a new coping skill of "talking to someone".   Affect/Mood: N/A   Participation Level: Did not attend    Clinical Observations/Individualized Feedback: Randy Shaffer did not attend group due to not being on the unit yet.  Plan: Continue to engage patient in RT group sessions 2-3x/week.   Rosina Lowenstein, LRT, CTRS 04/04/2023 3:36 PM

## 2023-04-04 NOTE — ED Notes (Signed)
Patient denies SI/HI at this time.  He is very agitated, and is pacing around the room.  Patient has been offered a pillow and blankets, to which he responded, "No, sometimes I don't sleep for 2 weeks at the time."

## 2023-04-04 NOTE — ED Notes (Signed)
Pt sitting in the floor on a pillow.

## 2023-04-04 NOTE — ED Provider Notes (Signed)
Emergency Medicine Observation Re-evaluation Note  Randy Shaffer is a 72 y.o. male, seen on rounds today.  Pt initially presented to the ED for complaints of Psychiatric Evaluation Currently, the patient is resting.  Physical Exam  BP (!) 202/109 (BP Location: Right Arm)   Pulse 93   Temp 98.5 F (36.9 C) (Oral)   Resp 18   SpO2 97%  Physical Exam Gen:  No acute distress Resp:  Breathing easily and comfortably, no accessory muscle usage Neuro:  Moving all four extremities, no gross focal neuro deficits Psych:  Resting currently, calm when awake ED Course / MDM  EKG:   I have reviewed the labs performed to date as well as medications administered while in observation.  Recent changes in the last 24 hours include initial EDP and psychiatry evaluation.  Plan  Current plan is for psychiatric inpatient treatment as per Channing Mutters, the psychiatry NP who evaluated the patient this evening.    Loleta Rose, MD 04/04/23 [phone]

## 2023-04-04 NOTE — ED Notes (Signed)
Patient was pacing in room when writer went to check on patient, patient laying in floor.

## 2023-04-04 NOTE — ED Notes (Signed)
Patient denies breakfast tray at this time.

## 2023-04-04 NOTE — ED Notes (Signed)
Pt remains in the floor lying with a pillow.

## 2023-04-04 NOTE — ED Notes (Signed)
IVC/  PENDING  PLACEMENT 

## 2023-04-04 NOTE — ED Notes (Signed)
EDT knocked, announced herself, then entered pts room. Pt found laying on floor within room underneath tv with a blanket under his head as a pillow. Pt appeared in no acute distress. Pt refused to allow EDT take vitals, responding with "You can take them on my toe" and raised his right foot at EDT. Pt informed vitals cannot be taken on a toe. Pt refused to have vitals take 3 more times stating "I refuse." Pt also refused to get his dinner tray and refused to get a snack. Pt denied any further needs at this time.

## 2023-04-04 NOTE — ED Notes (Signed)
Assumed care of patient. Patient laying on the floor. Does not want to get into bed at this time.

## 2023-04-04 NOTE — ED Notes (Signed)
Pt pacing in room.

## 2023-04-04 NOTE — ED Notes (Signed)
Pt refusing to eat any meals while here.

## 2023-04-04 NOTE — ED Notes (Addendum)
Pt remains pacing in room. This RN offered home meds, pt reports "I'm not taking anything while I'm here unless they let me go home and if they dont, I'm never taking medication again when I do get released". Pt reports "I have a court date tomorrow to appeal this and if I dont go, I'm suing". Pt refuses medications, food, or drink

## 2023-04-04 NOTE — ED Notes (Signed)
Patient pacing in room

## 2023-04-04 NOTE — Consult Note (Signed)
Telepsych Consultation   Reason for Consult:  psychiatric evaluation  Referring Physician:  Chesley Noon MD  Location of Patient: [facility] Location of Provider: Other: [facility]  Patient Identification: Randy Shaffer MRN:  [id number] Principal Diagnosis: Suicidal ideation Diagnosis:  Aggression and behavioral concern in adult  Total Time spent with patient: 20 minutes  Subjective:   Randy Shaffer is a 72 y.o. male patient admitted with SI wanting to kill self with a gun,  currently under IVC.  HPI:  Randy Shaffer, 72 y/o male with no confirmed psychiatric history, was seen via telemetry medicine for psych evaluation.  Patient is currently on the IVC because of suicide ideation wanted to kill himself per police report.  Patient is alert and oriented x 4, observed sitting in his room in a chair patient answers questions appropriately cooperative with the interview process,  however pt was very angry and blunt at time with his answers.  According to patient he just wanted to be left alone stating he is very angry.  Per the patient I used to live with my wife but she needs to be gone when I get home she gets on my nerves.  According to patient he and his wife are both retired he has been retired for the past 3 years and he just want to be left alone according to the patient he normally would go in the woods for days because he likes to be left alone.  Per the patient he is planning to kill the power to the house shot the house down and he his intention is to go to the mountains for 5 months and be left alone.  When asked if he had done this in the past patient stated yes.    According to patient he does have 1 child who [place].  Patient stated his wife need to be gone.  Per the patient no one is going to stop him and there will be a mixed time per the patient when he leaves here he is going to walk 6 hours to get to his truck and then next couple of hours to get to the mountains.  Patient is  adamant that he wants to be alone and he does not want his wife at the house. Patient currently denies seeing a psychiatrist or therapist currently denies having any psychiatric history.  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  Patient denies ever being hospitalized for any psychiatric problems.  Patient denies smoking, denies alcohol use, or illicit drug use.  Patient currently denies SI, HI, AVH or paranoia.  Patient reports he only takes blood thinners.  Patient stated I am angry with the world and just want to be left alone. According to patient his guns are all over the place.  Patient appears to be very angry and agitated and does not seem to be at a place where he can think rational at this time. When Clinical research associate discussed with patient the disposition of staying in the hospital for a couple more days patient stated he wanted to his lawyer within 36 hours.   Patient does pose a risk to himself and others at this time. At this time there is a great concern for the safety of the patient wife.  Patient emotions are very high at this time and therefore patient needs to be admitted to inpatient with continuous evaluation.   Past Psychiatric History: Unknown  Risk to Self:  yes Risk to Others:  yes Prior  Inpatient Therapy:  Unknown Prior Outpatient Therapy: Unknown   Past Medical History: See chart on Past Medical History:  Diagnosis Date   Anginal pain (HCC)    Aortic atherosclerosis (HCC)    BPH (benign prostatic hyperplasia)    CAD (coronary artery disease) 12/13/2021   a.) R/LHC 12/13/2021: EF 55-65%, 25% dLM-oLAD, 25% pLAD, 50% m-dLAD; mPA 20, mPCWP 10, Ao sat 96, PA sat 71, CO 5.7.   Cardiac murmur    Carotid artery disease (HCC)    a.) CTA neck 03/02/2022: 70% LICA; b.) PTA 04/19/2022: 80% LICA --> POBA performed  followed by placement of a 9 mm prox, 7mm distal, 4 cm long exact stent placed; 10-15% residual stenosis; c.) carotid doppler 05/22/2022: 1-39% BICA   Complication of  anesthesia    a.) PONV   Depression    Diet-controlled type 2 diabetes mellitus (HCC)    Diverticulosis 09/05/2011   DOE (dyspnea on exertion)    History of bilateral cataract extraction    HLD (hyperlipidemia)    Hypertension    Insomnia    a.) takes trazodone PRN   Long term current use of antithrombotics/antiplatelets    a.) on daily DAPT (ASA + clopidogrel)   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    OA (osteoarthritis) of knee    PONV (postoperative nausea and vomiting)    SEVERE   Sleep apnea    a.) unable to tolerate nocturnal PAP therapy    Past Surgical History:  Procedure Laterality Date   AMPUTATION FINGER / THUMB Right    THUMB PLUS 3 RECONSTRUCTIVE SURGERIES   CAROTID PTA/STENT INTERVENTION Left 04/16/2022   Procedure: CAROTID PTA/STENT INTERVENTION;  Surgeon: Annice Needy, MD;  Location: [facility] INVASIVE CV LAB;  Service: Cardiovascular;  Laterality: Left;   COLONOSCOPY WITH PROPOFOL N/A 02/01/2023   Procedure: COLONOSCOPY WITH PROPOFOL;  Surgeon: Regis Bill, MD;  Location: [facility] ENDOSCOPY;  Service: Endoscopy;  Laterality: N/A;   CORONARY ANGIOPLASTY     HEAD LACERATION REPAIR     HERNIA REPAIR  09/24/1998   ONE AS INFANT   HIP NERVE RELEASE     HOLEP-LASER ENUCLEATION OF THE PROSTATE WITH MORCELLATION N/A 08/03/2022   Procedure: HOLEP-LASER ENUCLEATION OF THE PROSTATE WITH MORCELLATION;  Surgeon: Sondra Come, MD;  Location: [facility] ORS;  Service: Urology;  Laterality: N/A;   KNEE ARTHROSCOPY Right 09/24/2001   KNEE ARTHROSCOPY Right 02/23/2015   Procedure: ARTHROSCOPY KNEE WITH DEBRIDEMENT PARTIAL MEDIAL MENISECTOMY;  Surgeon: Christena Flake, MD;  Location: [facility];  Service: Orthopedics;  Laterality: Right;   PHOTOREFRACTIVE KERATOTOMY     RIGHT/LEFT HEART CATH AND CORONARY ANGIOGRAPHY N/A 12/13/2021   Procedure: RIGHT/LEFT HEART CATH AND CORONARY ANGIOGRAPHY;  Surgeon: Alwyn Pea, MD;  Location: [facility] INVASIVE CV LAB;  Service: Cardiovascular;   Laterality: N/A;   Family History:  Family History  Problem Relation Age of Onset   Heart disease Mother    Cancer Mother    Heart attack Father    Cancer Sister    Cancer Brother    Heart attack Maternal Uncle    Family Psychiatric  History: Unknown Social History: None Social History   Substance and Sexual Activity  Alcohol Use Yes   Comment: 2-3 BEER/MONTH     Social History   Substance and Sexual Activity  Drug Use No    Social History   Socioeconomic History   Marital status: Married    Spouse name: Olegario Messier   Number of children: Not on  file   Years of education: Not on file   Highest education level: Not on file  Occupational History   Not on file  Tobacco Use   Smoking status: Never    Passive exposure: Never   Smokeless tobacco: Never  Vaping Use   Vaping status: Never Used  Substance and Sexual Activity   Alcohol use: Yes    Comment: 2-3 BEER/MONTH   Drug use: No   Sexual activity: Yes    Birth control/protection: None  Other Topics Concern   Not on file  Social History Narrative   Not on file   Social Determinants of Health   Financial Resource Strain: Patient Declined (12/24/2022)   Received from [facility]   Overall Financial Resource Strain (CARDIA)    Difficulty of Paying Living Expenses: Patient declined  Food Insecurity: Patient Declined (12/24/2022)   Received from [facility]   Hunger Vital Sign    Worried About Running Out of Food in the Last Year: Patient declined    Ran Out of Food in the Last Year: Patient declined  Transportation Needs: Patient Declined (12/24/2022)   Received [facility] - Transportation    In the past 12 months, has lack of transportation kept you from medical appointments or from getting medications?: Patient declined    Lack of Transportation (Non-Medical): Patient declined  Physical Activity: Not on file  Stress: Not on file  Social  Connections: Not on file   Additional Social History:    Allergies:   Allergies  Allergen Reactions   Thiopental Sodium [Thiopental] Nausea And Vomiting    SEVERE VOMITING    Labs:  Results for orders placed or performed during the hospital encounter of 04/03/23 (from the past 48 hour(s))  Comprehensive metabolic panel     Status: Abnormal   Collection Time: 04/03/23  2:14 PM  Result Value Ref Range   Sodium 139 135 - 145 mmol/L   Potassium 4.0 3.5 - 5.1 mmol/L   Chloride 105 98 - 111 mmol/L   CO2 23 22 - 32 mmol/L   Glucose, Bld 155 (H) 70 - 99 mg/dL    Comment: Glucose reference range applies only to samples taken after fasting for at least 8 hours.   BUN 21 8 - 23 mg/dL   Creatinine, Ser 8.29 0.61 - 1.24 mg/dL   Calcium 9.1 8.9 - 56.2 mg/dL   Total Protein 7.3 6.5 - 8.1 g/dL   Albumin 4.7 3.5 - 5.0 g/dL   AST 26 15 - 41 U/L   ALT 28 0 - 44 U/L   Alkaline Phosphatase 68 38 - 126 U/L   Total Bilirubin 1.2 0.3 - 1.2 mg/dL   GFR, Estimated >13 >08 mL/min    Comment: (NOTE) Calculated using the CKD-EPI Creatinine Equation (2021)    Anion gap 11 5 - 15    Comment: Performed at [facility], [address] [place], [place] [address]  Ethanol     Status: None   Collection Time: 04/03/23  2:14 PM  Result Value Ref Range   Alcohol, Ethyl (B) <10 <10 mg/dL    Comment: (NOTE) Lowest detectable limit for serum alcohol is 10 mg/dL.  For medical purposes only. Performed at [facility], [address], [place], [place] [address]   Salicylate level     Status: Abnormal   Collection Time: 04/03/23  2:14 PM  Result Value Ref Range   Salicylate Lvl <7.0 (  L) 7.0 - 30.0 mg/dL    Comment: Performed at [facility], [address] [place], [place] [address]  Acetaminophen level     Status: None   Collection Time: 04/03/23  2:14 PM  Result Value Ref Range   Acetaminophen (Tylenol), Serum 13 10 - 30 ug/mL    Comment: (NOTE) Therapeutic  concentrations vary significantly. A range of 10-30 ug/mL  may be an effective concentration for many patients. However, some  are best treated at concentrations outside of this range. Acetaminophen concentrations >150 ug/mL at 4 hours after ingestion  and >50 ug/mL at 12 hours after ingestion are often associated with  toxic reactions.  Performed at [facility], [address] [place], [place] [address]   cbc     Status: None   Collection Time: 04/03/23  2:14 PM  Result Value Ref Range   WBC 5.8 4.0 - 10.5 K/uL   RBC 5.70 4.22 - 5.81 MIL/uL   Hemoglobin 16.9 13.0 - 17.0 g/dL   HCT 09.8 11.9 - 14.7 %   MCV 87.0 80.0 - 100.0 fL   MCH 29.6 26.0 - 34.0 pg   MCHC 34.1 30.0 - 36.0 g/dL   RDW 82.9 56.2 - 13.0 %   Platelets 208 150 - 400 K/uL   nRBC 0.0 0.0 - 0.2 %    Comment: Performed at [facility], [address] [place], [place] [address]    Medications:  Current Facility-Administered Medications  Medication Dose Route Frequency Provider Last Rate Last Admin   aspirin EC tablet 81 mg  81 mg Oral Daily Chesley Noon, MD       atorvastatin (LIPITOR) tablet 20 mg  20 mg Oral QHS Chesley Noon, MD       clopidogrel (PLAVIX) tablet 75 mg  75 mg Oral Daily Chesley Noon, MD       metoprolol succinate (TOPROL-XL) 24 hr tablet 12.5 mg  12.5 mg Oral Daily Chesley Noon, MD       Current Outpatient Medications  Medication Sig Dispense Refill   albuterol (VENTOLIN HFA) 108 (90 Base) MCG/ACT inhaler Inhale into the lungs.     aspirin EC 81 MG tablet Take 81 mg by mouth daily. Swallow whole.     atorvastatin (LIPITOR) 40 MG tablet Take 20 mg by mouth in the morning.     Budeson-Glycopyrrol-Formoterol (BREZTRI AEROSPHERE) 160-9-4.8 MCG/ACT AERO Inhale into the lungs. Inhale 2 inhalations into the lungs 2 (two) times daily     clopidogrel (PLAVIX) 75 MG tablet TAKE 1 TABLET BY MOUTH EVERY DAY 90 tablet 2   isosorbide mononitrate (IMDUR) 30 MG 24 hr tablet Take  30 mg by mouth daily.     PARoxetine (PAXIL) 20 MG tablet TAKE 1/2 TAB DAILY FOR A WEEK THEN INCREASE TO 1 TAB DAILY AND CONTINUE THAT DOSE     FLUoxetine (PROZAC) 20 MG capsule Take 40 mg by mouth every morning. (Patient not taking: Reported on 04/03/2023)     metoprolol succinate (TOPROL-XL) 25 MG 24 hr tablet Take 12.5 mg by mouth in the morning and at bedtime. (Patient not taking: Reported on 04/03/2023)     tamsulosin (FLOMAX) 0.4 MG CAPS capsule Take 0.4 mg by mouth daily. (Patient not taking: Reported on 04/03/2023)     traZODone (DESYREL) 50 MG tablet TAKE 1.5-2 TABS BY MOUTH AT NIGHT FOR SLEEP (Patient not taking: Reported on 04/03/2023)      Musculoskeletal: Strength & Muscle Tone: within normal limits Gait & Station: normal  Patient leans: N/A          Psychiatric Specialty Exam:  Presentation  General Appearance:  Casual  Eye Contact: Good  Speech: Clear and Coherent  Speech Volume: Normal  Handedness: Right   Mood and Affect  Mood: Angry  Affect: Blunt   Thought Process  Thought Processes: Linear  Descriptions of Associations:Intact  Orientation:Full (Time, Place and Person)  Thought Content:Obsessions  History of Schizophrenia/Schizoaffective disorder:No  Duration of Psychotic Symptoms:No data recorded Hallucinations:Hallucinations: None  Ideas of Reference:None  Suicidal Thoughts:Suicidal Thoughts: No  Homicidal Thoughts:Homicidal Thoughts: No   Sensorium  Memory: Immediate Good  Judgment: Fair  Insight: Lacking   Executive Functions  Concentration: Fair  Attention Span: Good  Recall: Good  Fund of Knowledge: Good  Language: Good   Psychomotor Activity  Psychomotor Activity: Psychomotor Activity: Normal   Assets  Assets: Desire for Improvement; Resilience   Sleep  Sleep: Sleep: Fair Number of Hours of Sleep: 5    Physical Exam: Physical Exam HENT:     Head: Normocephalic.  Cardiovascular:      Rate and Rhythm: Normal rate.  Pulmonary:     Effort: Pulmonary effort is normal.  Musculoskeletal:        General: Normal range of motion.     Cervical back: Normal range of motion.  Neurological:     Mental Status: He is alert and oriented to person, place, and time.  Psychiatric:        Mood and Affect: Mood normal.        Behavior: Behavior normal.        Thought Content: Thought content normal.        Judgment: Judgment normal.   Review of Systems  Constitutional: Negative.   HENT: Negative.    Eyes: Negative.   Respiratory: Negative.    Cardiovascular: Negative.   Gastrointestinal: Negative.   Neurological: Negative.   Psychiatric/Behavioral:  Positive for suicidal ideas. The patient is nervous/anxious.    Blood pressure (!) 202/109, pulse 93, temperature 98.5 F (36.9 C), temperature source Oral, resp. rate 18, SpO2 97%. There is no height or weight on file to calculate BMI.  Treatment Plan Summary: Plan continue assessment  and evaluation   Disposition: Recommend psychiatric Inpatient admission when medically cleared.  This service was provided via telemedicine using a 2-way, interactive audio and video technology.  Names of all persons participating in this telemedicine service and their role in this encounter. Name: Biagio Borg Role: Patient  Name: Sindy Guadeloupe  Role: NP  Name: Lanny Cramp Role: RN  Name:  Role:     Sindy Guadeloupe, NP 04/04/2023 3:49 AM

## 2023-04-04 NOTE — ED Notes (Signed)
BH NP is talking to patient via telehealth monitor now

## 2023-04-04 NOTE — ED Notes (Signed)
Pt refused lunch tray 

## 2023-04-05 ENCOUNTER — Inpatient Hospital Stay
Admission: RE | Admit: 2023-04-05 | Discharge: 2023-04-11 | DRG: 880 | Disposition: A | Discharge status: Home / Self Care | Payer: Medicare Other | Source: Intra-hospital | Attending: Psychiatry | Admitting: Psychiatry

## 2023-04-05 ENCOUNTER — Other Ambulatory Visit: Payer: Self-pay

## 2023-04-05 ENCOUNTER — Encounter: Payer: Self-pay | Admitting: Psychiatry

## 2023-04-05 DIAGNOSIS — Z7982 Long term (current) use of aspirin: Secondary | ICD-10-CM

## 2023-04-05 DIAGNOSIS — R45851 Suicidal ideations: Principal | ICD-10-CM

## 2023-04-05 DIAGNOSIS — F32A Depression, unspecified: Secondary | ICD-10-CM | POA: Diagnosis present

## 2023-04-05 DIAGNOSIS — G4700 Insomnia, unspecified: Secondary | ICD-10-CM | POA: Diagnosis present

## 2023-04-05 DIAGNOSIS — Z9861 Coronary angioplasty status: Secondary | ICD-10-CM | POA: Diagnosis not present

## 2023-04-05 DIAGNOSIS — F419 Anxiety disorder, unspecified: Secondary | ICD-10-CM | POA: Diagnosis present

## 2023-04-05 DIAGNOSIS — Z634 Disappearance and death of family member: Secondary | ICD-10-CM

## 2023-04-05 DIAGNOSIS — Z79899 Other long term (current) drug therapy: Secondary | ICD-10-CM | POA: Diagnosis not present

## 2023-04-05 DIAGNOSIS — Z8249 Family history of ischemic heart disease and other diseases of the circulatory system: Secondary | ICD-10-CM | POA: Diagnosis not present

## 2023-04-05 DIAGNOSIS — I10 Essential (primary) hypertension: Secondary | ICD-10-CM | POA: Diagnosis present

## 2023-04-05 DIAGNOSIS — N400 Enlarged prostate without lower urinary tract symptoms: Secondary | ICD-10-CM | POA: Diagnosis present

## 2023-04-05 DIAGNOSIS — I2510 Atherosclerotic heart disease of native coronary artery without angina pectoris: Secondary | ICD-10-CM | POA: Diagnosis present

## 2023-04-05 MED ORDER — ACETAMINOPHEN 325 MG PO TABS: 650.0000 mg | ORAL_TABLET | Freq: Four times a day (QID) | ORAL | Status: DC | PRN

## 2023-04-05 MED ORDER — ALUM & MAG HYDROXIDE-SIMETH 200-200-20 MG/5ML PO SUSP: 30.0000 mL | ORAL | Status: DC | PRN

## 2023-04-05 MED ORDER — LORAZEPAM 1 MG PO TABS: 1.0000 mg | ORAL_TABLET | ORAL | Status: DC | PRN

## 2023-04-05 MED ORDER — METOPROLOL SUCCINATE ER 25 MG PO TB24
12.5000 mg | ORAL_TABLET | Freq: Every day | ORAL | Status: DC
  Administered 2023-04-09 – 2023-04-11 (×3): 12.5 mg via ORAL
  Filled 2023-04-05 (×4): qty 1

## 2023-04-05 MED ORDER — TRAZODONE HCL 50 MG PO TABS
25.0000 mg | ORAL_TABLET | Freq: Every evening | ORAL | Status: DC | PRN
  Administered 2023-04-10: 25 mg via ORAL
  Filled 2023-04-05: qty 1

## 2023-04-05 MED ORDER — OLANZAPINE 5 MG PO TBDP: 10.0000 mg | ORAL_TABLET | Freq: Three times a day (TID) | ORAL | Status: DC | PRN

## 2023-04-05 MED ORDER — HYDROXYZINE HCL 25 MG PO TABS
50.0000 mg | ORAL_TABLET | Freq: Three times a day (TID) | ORAL | Status: DC | PRN
  Administered 2023-04-10: 50 mg via ORAL
  Filled 2023-04-05: qty 2

## 2023-04-05 MED ORDER — CLOPIDOGREL BISULFATE 75 MG PO TABS
75.0000 mg | ORAL_TABLET | Freq: Every day | ORAL | Status: DC
  Administered 2023-04-09 – 2023-04-11 (×3): 75 mg via ORAL
  Filled 2023-04-05 (×4): qty 1

## 2023-04-05 MED ORDER — MAGNESIUM HYDROXIDE 400 MG/5ML PO SUSP: 30.0000 mL | Freq: Every day | ORAL | Status: DC | PRN

## 2023-04-05 MED ORDER — ASPIRIN 81 MG PO TBEC
81.0000 mg | DELAYED_RELEASE_TABLET | Freq: Every day | ORAL | Status: DC
  Administered 2023-04-09 – 2023-04-11 (×3): 81 mg via ORAL
  Filled 2023-04-05 (×4): qty 1

## 2023-04-05 MED ORDER — ZIPRASIDONE MESYLATE 20 MG IM SOLR: 20.0000 mg | INTRAMUSCULAR | Status: DC | PRN

## 2023-04-05 MED ORDER — ATORVASTATIN CALCIUM 10 MG PO TABS
20.0000 mg | ORAL_TABLET | Freq: Every day | ORAL | Status: DC
  Administered 2023-04-08 – 2023-04-10 (×3): 20 mg via ORAL
  Filled 2023-04-05 (×6): qty 2

## 2023-04-05 NOTE — Tx Team (Signed)
Initial Treatment Plan 04/05/2023 11:39 AM Boneta Lucks ZOX:[id number]    PATIENT STRESSORS: Marital or family conflict     PATIENT STRENGTHS: Capable of independent living  Communication skills  General fund of knowledge    PATIENT IDENTIFIED PROBLEMS: " I just want to be alone away from people"                     DISCHARGE CRITERIA:  Ability to meet basic life and health needs Adequate post-discharge living arrangements Improved stabilization in mood, thinking, and/or behavior  PRELIMINARY DISCHARGE PLAN: Return to previous living arrangement  PATIENT/FAMILY INVOLVEMENT: This treatment plan has been presented to and reviewed with the patient, Randy Shaffer.  The patient has been given the opportunity to ask questions and make suggestions.  Doneen Poisson, RN 04/05/2023, 11:39 AM

## 2023-04-05 NOTE — ED Notes (Signed)
Pt refuses to let this RN to obtain vitals. Reports "when I get my hearing I'm walking out of here". Pt also refusing breakfast

## 2023-04-05 NOTE — Group Note (Signed)
Recreation Therapy Group Note   Group Topic:Health and Wellness  Group Date: 04/05/2023 Start Time: 1400 End Time: 1445 Facilitators: Rosina Lowenstein, LRT, CTRS Location:  Day Room  Group Description: Seated Exercise. LRT discussed the mental and physical benefits of exercise. LRT and group discussed how physical activity can be used as a coping skill. Pt's and LRT followed along to an exercise video on the TV screen that provided a visual representation and audio description of every exercise performed. Pt's encouraged to listen to their bodies and stop at any time if they experience feelings of discomfort or pain. LRT passed out water after session was over and encouraged pts do drink and stay hydrated.  Goal Area(s) Addressed: Patient will learn benefits of physical activity. Patient will identify exercise as a coping skill.  Patient will follow multistep directions. Patient will try a new leisure interest.   Affect/Mood: N/A   Participation Level: Did not attend    Clinical Observations/Individualized Feedback: Refoel did not attend group.  Plan: Continue to engage patient in RT group sessions 2-3x/week.   Rosina Lowenstein, LRT, CTRS 04/05/2023 3:16 PM

## 2023-04-05 NOTE — Progress Notes (Signed)
Admission Note:  Randy Shaffer) is a 72 year old male admitted under IVC from the [facility]  ER.  Patient states he was brought to the hospital "because I wanted to be left alone."   Patient explains he was trying to get away from his wife and ran into the woods. Patient denies SI, HI and AVH. Pain rated 2/10 in his legs (scratches).  Denies feelings of anxiety.  Reports he does not believe he is depressed, but he does wish to be alone.  "I'm not retuning home when I leave here. I am going to walk to [place]."     Patient was cooperative with assessment.  Questions answered appropriately.  Skin assessment completed with Alphonzo Lemmings, RN.  No contraband found.  Back, torso and legs have multiple scratches and bug bites.  Patient reports this was from his time in the woods.   The patient stated his goal is to "get the hell out of here."  When asked about social support, the patient became tearful.  After a few second he spoke of a daughter and 2 grandchildren. Denies smoking cigarettes,  drinking alcohol or any other drug use.   Patient ambulates independently without any assistive device.  No glasses, hearing aids or dentures.   Oriented to the unit.  No further needs at this time.

## 2023-04-05 NOTE — BHH Counselor (Signed)
Treatment team did not occur today as scheduled as directed by MD.    [name], MSW, [facility] 04/05/2023 4:28 PM

## 2023-04-05 NOTE — Progress Notes (Signed)
Patient refused all medications.  "I will take them when I get home."   RN explained the importance of these medications, but patient did not change his mind.

## 2023-04-05 NOTE — [facility] Counselor (Signed)
CSW contact pt's wife Randy Shaffer [phone]) per pt's request to let her know "he was under our care". Pt's wife expressed gratefulness for the call.   [name], MSW, [facility] 04/05/2023 4:42 PM

## 2023-04-05 NOTE — ED Notes (Addendum)
Patient was amendable to allowing [place] to get vitals. Patient was given socks to wear and a blanket. When asked why he was sleeping on the floor, the patient states he sleeps on the floor at home. Patient was made aware that he would be moving to another area.

## 2023-04-05 NOTE — Plan of Care (Signed)

## 2023-04-05 NOTE — ED Notes (Signed)
Patient is walking around the room.

## 2023-04-05 NOTE — ED Provider Notes (Signed)
Emergency Medicine Observation Re-evaluation Note  Randy Shaffer is a 72 y.o. male, seen on rounds today.  Pt initially presented to the ED for complaints of Psychiatric Evaluation Currently, the patient is resting, voices no medical complaints.  Physical Exam  BP (!) 127/59 (BP Location: Left Arm)   Pulse (!) 57   Temp 97.7 F (36.5 C) (Axillary)   Resp 17   SpO2 100%  Physical Exam General: Resting in no acute distress Cardiac: No cyanosis Lungs: Equal rise and fall Psych: Not agitated  ED Course / MDM  EKG:   I have reviewed the labs performed to date as well as medications administered while in observation.  Recent changes in the last 24 hours include no events overnight.  Plan  Current plan is for psychiatric disposition.    Irean Hong, MD 04/05/23 [phone]

## 2023-04-05 NOTE — B[facility] Counselor (Signed)
CSW completed assessment with pt today, but CSW must type up documentation on assessment. CSW to complete on Monday.    [name], MSW, [place] 04/05/2023 4:33 PM

## 2023-04-05 NOTE — Group Note (Signed)
Date:  04/05/2023 Time:  12:13 PM  Group Topic/Focus:  Rediscovering Joy:   The focus of this group is to explore various ways to relieve stress in a positive manner.    Participation Level:  Did Not Attend  Participation Quality:    Affect:    Cognitive:    Insight:   Engagement in Group:    Modes of Intervention:    Additional Comments:  Did not attend   Khamron Gellert T Noe Gens 04/05/2023, 12:13 PM

## 2023-04-06 MED ORDER — MIRTAZAPINE 15 MG PO TABS
7.5000 mg | ORAL_TABLET | Freq: Every day | ORAL | Status: DC
  Administered 2023-04-07 – 2023-04-08 (×2): 7.5 mg via ORAL
  Filled 2023-04-06 (×3): qty 1

## 2023-04-06 MED ORDER — DIVALPROEX SODIUM 250 MG PO DR TAB
250.0000 mg | DELAYED_RELEASE_TABLET | Freq: Two times a day (BID) | ORAL | Status: DC
  Administered 2023-04-06 – 2023-04-11 (×9): 250 mg via ORAL
  Filled 2023-04-06 (×9): qty 1

## 2023-04-06 NOTE — Group Note (Signed)
Date:  04/06/2023 Time:  3:16 PM  Group Topic/Focus:  Coping With Mental Health Crisis:   The purpose of this group is to help patients identify strategies for coping with mental health crisis.  Group discusses possible causes of crisis and ways to manage them effectively. Rediscovering Joy:   The focus of this group is to explore various ways to relieve stress in a positive manner.  We went outside for fresh air and relaxation!    Participation Level:  Did Not Attend  Participation Quality:     Affect:    Cognitive:    Insight:   Engagement in Group:    Modes of Intervention:    Additional Comments:    Myer Bohlman L Kristyna Bradstreet 04/06/2023, 3:16 PM

## 2023-04-06 NOTE — Group Note (Signed)
Date:  04/06/2023 Time:  12:34 PM  Group Topic/Focus:  Coping With Mental Health Crisis:   The purpose of this group is to help patients identify strategies for coping with mental health crisis.  Group discusses possible causes of crisis and ways to manage them effectively.    Participation Level:  Did Not Attend  Participation Quality:    Affect:    Cognitive:    Insight:   Engagement in Group:    Modes of Intervention:    Additional Comments:    Randy Shaffer Randy Shaffer 04/06/2023, 12:34 PM

## 2023-04-06 NOTE — Plan of Care (Signed)
D- Patient alert and oriented. Affect is restricted/guarded Denies SI, HI, AVH, and pain. Pt stated his goal for today is to go home. Pt reused all offers of food or medication today.   A- Scheduled medications administered to patient, per MD orders. Support and encouragement provided.  Routine safety checks conducted every 15 minutes.  Patient informed to notify staff with problems or concerns.  R- No adverse drug reactions noted. Patient contracts for safety at this time. Patient compliant with medications and treatment plan. Patient receptive, calm, and cooperative. Patient interacts well with others on the unit.  Patient remains safe at this time.

## 2023-04-06 NOTE — H&P (Addendum)
Psychiatric Admission Assessment Adult  Patient Identification: Randy Shaffer MRN:  [id number] Date of Evaluation:  04/06/2023 Chief Complaint:  Suicidal ideation [R45.851] Suicidal ideations [R45.851] Principal Diagnosis: Suicidal ideation Diagnosis:  Principal Problem:   Suicidal ideation Active Problems:   Suicidal ideations MOOD DISORDER  History of Present Illness: Patient is currently on the IVC because of suicide ideation wanted to kill himself per police report. According to patient he and his wife are both retired he has been retired for the past 3 years and he just want to be left alone according to the patient he normally would go in the woods for days because he likes to be left alone. Per the patient he is planning to kill the power to the house, shot the house down, and  go to the mountains for 5 months and be left alone  Today  when approached, the patient was walking around his room and mentioned" I need to walk 200 miles".  During assessment  patient was labile, and tearful at times.  Patient reports that" I need a purpose in life".  Patient reports sometimes he wants to stay focused and finish work but his wife is in "nagging".  Patient said that he was frustrated and he made a comment that he wants to go woods or mountains  to get away from his wife.  Later patient started crying and said" my wife is going to leave me, I do not think she is going to let me come back home".  Patient was provided with support and reassurance.  Patient reports that he has 250 acres of woods in his backyard.  Patient also reported that "I do not like myself anymore, I need a purpose".  He denies any intention or plan to harm himself on the unit.  Patient was screened for bipolar disorder.  Patient said" I do better when I do not sleep".  Patient reports he has not slept in a week or so.  He also reports depressed mood, irritability, and variable energy level.  Patient reports that sometimes he feels  happy and his mind races, he is more irritable, and feels no need to sleep.  Patient does have  bipolar traits.  We discussed bipolar disorder and also psychotropic medications.  Patient is willing to try Depakote for mood stabilization and poor impulse control.  He agrees to try Remeron for depression.    Past Psychiatric History: Denied by patient  Is the patient at risk to self? Yes.    Has the patient been a risk to self in the past 6 months? No.  Has the patient been a risk to self within the distant past? No.  Is the patient a risk to others? No.  Has the patient been a risk to others in the past 6 months? No.  Has the patient been a risk to others within the distant past? No.   [place] Scale:  Flowsheet Row Admission (Current) from 04/05/2023 in [facility] ED from 04/03/2023 in [facility] Emergency Department at [facility] Admission (Discharged) from 02/01/2023 in [facility]  C-SSRS RISK CATEGORY No Risk High Risk No Risk        Alcohol Screening: 1. How often do you have a drink containing alcohol?: Never 2. How many drinks containing alcohol do you have on a typical day when you are drinking?: 1 or 2 3. How often do you have six or more drinks on one occasion?: Never AUDIT-C Score:  0 4. How often during the last year have you found that you were not able to stop drinking once you had started?: Never 5. How often during the last year have you failed to do what was normally expected from you because of drinking?: Never 6. How often during the last year have you needed a first drink in the morning to get yourself going after a heavy drinking session?: Never 7. How often during the last year have you had a feeling of guilt of remorse after drinking?: Never 8. How often during the last year have you been unable to remember what happened the night before because you had been drinking?: Never 9. Have you or someone else been  injured as a result of your drinking?: No 10. Has a relative or friend or a doctor or another health worker been concerned about your drinking or suggested you cut down?: No Alcohol Use Disorder Identification Test Final Score (AUDIT): 0 Alcohol Brief Interventions/Follow-up: Alcohol education/Brief advice Substance Abuse History in the last 12 months:   Denies use of alcohol, drugs, or nicotine   Past Medical History:  Past Medical History:  Diagnosis Date   Anginal pain (HCC)    Aortic atherosclerosis (HCC)    BPH (benign prostatic hyperplasia)    CAD (coronary artery disease) 12/13/2021   a.) R/LHC 12/13/2021: EF 55-65%, 25% dLM-oLAD, 25% pLAD, 50% m-dLAD; mPA 20, mPCWP 10, Ao sat 96, PA sat 71, CO 5.7.   Cardiac murmur    Carotid artery disease (HCC)    a.) CTA neck 03/02/2022: 70% LICA; b.) PTA 04/19/2022: 80% LICA --> POBA performed  followed by placement of a 9 mm prox, 7mm distal, 4 cm long exact stent placed; 10-15% residual stenosis; c.) carotid doppler 05/22/2022: 1-39% BICA   Complication of anesthesia    a.) PONV   Depression    Diet-controlled type 2 diabetes mellitus (HCC)    Diverticulosis 09/05/2011   DOE (dyspnea on exertion)    History of bilateral cataract extraction    HLD (hyperlipidemia)    Hypertension    Insomnia    a.) takes trazodone PRN   Long term current use of antithrombotics/antiplatelets    a.) on daily DAPT (ASA + clopidogrel)   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    OA (osteoarthritis) of knee    PONV (postoperative nausea and vomiting)    SEVERE   Sleep apnea    a.) unable to tolerate nocturnal PAP therapy    Past Surgical History:  Procedure Laterality Date   AMPUTATION FINGER / THUMB Right    THUMB PLUS 3 RECONSTRUCTIVE SURGERIES   CAROTID PTA/STENT INTERVENTION Left 04/16/2022   Procedure: CAROTID PTA/STENT INTERVENTION;  Surgeon: Annice Needy, MD;  Location: [facility] INVASIVE CV LAB;  Service: Cardiovascular;  Laterality: Left;   COLONOSCOPY  WITH PROPOFOL N/A 02/01/2023   Procedure: COLONOSCOPY WITH PROPOFOL;  Surgeon: Regis Bill, MD;  Location: [facility] ENDOSCOPY;  Service: Endoscopy;  Laterality: N/A;   CORONARY ANGIOPLASTY     HEAD LACERATION REPAIR     HERNIA REPAIR  09/24/1998   ONE AS INFANT   HIP NERVE RELEASE     HOLEP-LASER ENUCLEATION OF THE PROSTATE WITH MORCELLATION N/A 08/03/2022   Procedure: HOLEP-LASER ENUCLEATION OF THE PROSTATE WITH MORCELLATION;  Surgeon: Sondra Come, MD;  Location: [facility] ORS;  Service: Urology;  Laterality: N/A;   KNEE ARTHROSCOPY Right 09/24/2001   KNEE ARTHROSCOPY Right 02/23/2015   Procedure: ARTHROSCOPY KNEE WITH DEBRIDEMENT PARTIAL MEDIAL MENISECTOMY;  Surgeon: Christena Flake, MD;  Location: [facility];  Service: Orthopedics;  Laterality: Right;   PHOTOREFRACTIVE KERATOTOMY     RIGHT/LEFT HEART CATH AND CORONARY ANGIOGRAPHY N/A 12/13/2021   Procedure: RIGHT/LEFT HEART CATH AND CORONARY ANGIOGRAPHY;  Surgeon: Alwyn Pea, MD;  Location: [facility] INVASIVE CV LAB;  Service: Cardiovascular;  Laterality: N/A;   Family History:  Family History  Problem Relation Age of Onset   Heart disease Mother    Cancer Mother    Heart attack Father    Cancer Sister    Cancer Brother    Heart attack Maternal Uncle    Tobacco Screening:  Social History   Tobacco Use  Smoking Status Never   Passive exposure: Never  Smokeless Tobacco Never    BH Tobacco Counseling     Are you interested in Tobacco Cessation Medications?  N/A, patient does not use tobacco products Counseled patient on smoking cessation:  N/A, patient does not use tobacco products Reason Tobacco Screening Not Completed: No value filed.       Social History:  Social History   Substance and Sexual Activity  Alcohol Use Yes   Comment: 2-3 BEER/MONTH     Social History   Substance and Sexual Activity  Drug Use No    Additional Social History:                           Allergies:    Allergies  Allergen Reactions   Thiopental Sodium [Thiopental] Nausea And Vomiting    SEVERE VOMITING   Lab Results: No results found for this or any previous visit (from the past 48 hour(s)).  Blood Alcohol level:  Lab Results  Component Value Date   ETH <10 04/03/2023    Metabolic Disorder Labs:  No results found for: "HGBA1C", "MPG" No results found for: "PROLACTIN" No results found for: "CHOL", "TRIG", "HDL", "CHOLHDL", "VLDL", "LDLCALC"  Current Medications: Current Facility-Administered Medications  Medication Dose Route Frequency Provider Last Rate Last Admin   acetaminophen (TYLENOL) tablet 650 mg  650 mg Oral Q6H PRN Sindy Guadeloupe, NP       alum & mag hydroxide-simeth (MAALOX/MYLANTA) 200-200-20 MG/5ML suspension 30 mL  30 mL Oral Q4H PRN Bennett, Christal H, NP       aspirin EC tablet 81 mg  81 mg Oral Daily Bennett, Christal H, NP       atorvastatin (LIPITOR) tablet 20 mg  20 mg Oral QHS Bennett, Christal H, NP       clopidogrel (PLAVIX) tablet 75 mg  75 mg Oral Daily Bennett, Christal H, NP       hydrOXYzine (ATARAX) tablet 50 mg  50 mg Oral TID PRN Bennett, Christal H, NP       OLANZapine zydis (ZYPREXA) disintegrating tablet 10 mg  10 mg Oral Q8H PRN Sindy Guadeloupe, NP       And   LORazepam (ATIVAN) tablet 1 mg  1 mg Oral PRN Sindy Guadeloupe, NP       And   ziprasidone (GEODON) injection 20 mg  20 mg Intramuscular PRN Sindy Guadeloupe, NP       magnesium hydroxide (MILK OF MAGNESIA) suspension 30 mL  30 mL Oral Daily PRN Sindy Guadeloupe, NP       metoprolol succinate (TOPROL-XL) 24 hr tablet 12.5 mg  12.5 mg Oral Daily Bennett, Christal H, NP       traZODone (DESYREL) tablet 25 mg  25 mg Oral QHS  PRN Thurston Hole H, NP       PTA Medications: Medications Prior to Admission  Medication Sig Dispense Refill Last Dose   albuterol (VENTOLIN HFA) 108 (90 Base) MCG/ACT inhaler Inhale into the lungs.      aspirin EC 81 MG tablet Take 81 mg by mouth daily. Swallow whole.       atorvastatin (LIPITOR) 40 MG tablet Take 20 mg by mouth in the morning.      Budeson-Glycopyrrol-Formoterol (BREZTRI AEROSPHERE) 160-9-4.8 MCG/ACT AERO Inhale into the lungs. Inhale 2 inhalations into the lungs 2 (two) times daily      clopidogrel (PLAVIX) 75 MG tablet TAKE 1 TABLET BY MOUTH EVERY DAY 90 tablet 2    FLUoxetine (PROZAC) 20 MG capsule Take 40 mg by mouth every morning. (Patient not taking: Reported on 04/03/2023)      isosorbide mononitrate (IMDUR) 30 MG 24 hr tablet Take 30 mg by mouth daily.      metoprolol succinate (TOPROL-XL) 25 MG 24 hr tablet Take 12.5 mg by mouth in the morning and at bedtime. (Patient not taking: Reported on 04/03/2023)      PARoxetine (PAXIL) 20 MG tablet TAKE 1/2 TAB DAILY FOR A WEEK THEN INCREASE TO 1 TAB DAILY AND CONTINUE THAT DOSE      tamsulosin (FLOMAX) 0.4 MG CAPS capsule Take 0.4 mg by mouth daily. (Patient not taking: Reported on 04/03/2023)      traZODone (DESYREL) 50 MG tablet TAKE 1.5-2 TABS BY MOUTH AT NIGHT FOR SLEEP (Patient not taking: Reported on 04/03/2023)      Musculoskeletal: Strength & Muscle Tone: within normal limits Gait & Station: normal Patient leans: N/A  Psychiatric Specialty Exam:  Presentation  General Appearance:  Casual  Eye Contact: Good  Speech: Clear and Coherent  Speech Volume: Normal  Handedness: Right   Mood and Affect  Mood: Angry  Affect: Labile, tearful, crying at time   Thought Process  Thought Processes: Circumstantial  Descriptions of Associations:Intact  Orientation:Full (Time, Place and Person)  Thought Content:Obsessions  History of Schizophrenia/Schizoaffective disorder:No  Hallucinations:None reported Ideas of Reference:None  Suicidal Thoughts:Yes, with a plan Homicidal Thoughts:Denies  Sensorium  Memory: Immediate Good  Judgment: Poor  Insight: Poor   Executive Functions  Concentration: Fair  Attention Span: Fair  Recall: Good  Fund of  Knowledge: Good  Language: Good   Psychomotor Activity  Psychomotor Activity:Agitated  Assets  Assets: Desire for Improvement; Resilience   Sleep  Sleep:Poor   Physical Exam: HENT:     Head: Normocephalic.  Cardiovascular:     Rate and Rhythm: Normal rate.  Pulmonary:     Effort: Pulmonary effort is normal.  Musculoskeletal:        General: Normal range of motion.     Cervical back: Normal range of motion.  Neurological:     Mental Status: He is alert and oriented to person, place, and time.  Psychiatric:        Mood and Affect: Mood normal.        Behavior: Behavior normal.        Thought Content: Thought content normal.        Judgment: Judgment normal.    Review of Systems  Constitutional: Negative.   HENT: Negative.    Eyes: Negative.   Respiratory: Negative.    Cardiovascular: Negative.   Gastrointestinal: Negative.   Neurological: Negative.   Psychiatric/Behavioral:  Positive for suicidal ideas. The patient is nervous/anxious.     Blood pressure (!) 144/73, pulse Marland Kitchen)  54, temperature 97.7 F (36.5 C), resp. rate 16, height 6\' 4"  (1.93 m), weight 99.1 kg, SpO2 100%. Body mass index is 26.6 kg/m.  Treatment Plan Summary: Daily contact with patient to assess and evaluate symptoms and progress in treatment and Medication management  Observation Level/Precautions:  15 minute checks  Laboratory:    Psychotherapy:    Medications: Patient agrees to try Depakote for mood stabilization and Remeron for depression side effects discussed with patient  Consultations:    Discharge Concerns:    Estimated LOS:  Other:     Physician Treatment Plan for Primary Diagnosis: Suicidal ideation Long Term Goal(s): Improvement in symptoms so as ready for discharge  Short Term Goals: Ability to identify changes in lifestyle to reduce recurrence of condition will improve, Ability to verbalize feelings will improve, Ability to disclose and discuss suicidal ideas, Ability to  demonstrate self-control will improve, Ability to identify and develop effective coping behaviors will improve, Compliance with prescribed medications will improve, and Ability to identify triggers associated with substance abuse/mental health issues will improve   I certify that inpatient services furnished can reasonably be expected to improve the patient's condition.    Lewanda Rife, MD  I spoke with patient's wife via phone after getting verbal consent from the patient.  Patient's wife reports that they have been married for 17 years.  She reports she has not seen  patient  "like this" before.  Patient's wife confirmed that patient retired 3 years ago. She said she  recently retired about 3 months ago and is more around patient and is noticing change in his personality.  Patient's wife said that he goes through mood swing 1 minute he fine,then he is irritated, next minute he is down.  She said he is all over the place and she is not able to keep up with his mood swings and personality.  Patient's wife also reports there are guns in the house, a and they are secured in a safe.  Patient's wife's questions were answered to her satisfaction and she thanked Retail banker.

## 2023-04-06 NOTE — [facility] Suicide Risk Assessment (Signed)
[facility] Admission Suicide Risk Assessment   Nursing information obtained from:    Demographic factors:  Male, Age 72 or older, Access to firearms Current Mental Status:  NA (currently denies) Loss Factors:  Decline in physical health Historical Factors:  Impulsivity Risk Reduction Factors:  Responsible for children under 64 years of age  Principal Problem: Suicidal ideation Diagnosis:  Principal Problem:   Suicidal ideation Active Problems:   Suicidal ideations  Subjective Data: Patient is currently on the IVC because of suicide ideation wanted to kill himself per police report. According to patient he and his wife are both retired he has been retired for the past 3 years and he just want to be left alone according to the patient he normally would go in the woods for days because he likes to be left alone. Per the patient he is planning to kill the power to the house, shot the house down, and  go to the mountains for 5 months and be left alone.   Continued Clinical Symptoms:  Alcohol Use Disorder Identification Test Final Score (AUDIT): 0 The "Alcohol Use Disorders Identification Test", Guidelines for Use in Primary Care, Second Edition.  World Science writer [facility]). Score between 0-7:  no or low risk or alcohol related problems. Score between 8-15:  moderate risk of alcohol related problems. Score between 16-19:  high risk of alcohol related problems. Score 20 or above:  warrants further diagnostic evaluation for alcohol dependence and treatment.   CLINICAL FACTORS:   Depression:   Anhedonia Hopelessness Insomnia Severe   Musculoskeletal: Strength & Muscle Tone: within normal limits Gait & Station: normal Patient leans: N/A  Psychiatric Specialty Exam:  Presentation  General Appearance:  Casual  Eye Contact: Good  Speech: Clear and Coherent  Speech Volume: Normal  Handedness: Right   Mood and Affect  Mood: Angry  Affect: Labile, tearful, crying at  time   Thought Process  Thought Processes: Circumstantial  Descriptions of Associations:Intact  Orientation:Full (Time, Place and Person)  Thought Content:Obsessions  History of Schizophrenia/Schizoaffective disorder:No  Hallucinations:None reported Ideas of Reference:None  Suicidal Thoughts:Yes, with a plan Homicidal Thoughts:Denies  Sensorium  Memory: Immediate Good  Judgment: Poor  Insight: Poor   Executive Functions  Concentration: Fair  Attention Span: Fair  Recall: Good  Fund of Knowledge: Good  Language: Good   Psychomotor Activity  Psychomotor Activity:Agitated  Assets  Assets: Desire for Improvement; Resilience   Sleep  Sleep:Poor   Physical Exam: HENT:     Head: Normocephalic.  Cardiovascular:     Rate and Rhythm: Normal rate.  Pulmonary:     Effort: Pulmonary effort is normal.  Musculoskeletal:        General: Normal range of motion.     Cervical back: Normal range of motion.  Neurological:     Mental Status: He is alert and oriented to person, place, and time.  Psychiatric:        Mood and Affect: Mood normal.        Behavior: Behavior normal.        Thought Content: Thought content normal.        Judgment: Judgment normal.    Review of Systems  Constitutional: Negative.   HENT: Negative.    Eyes: Negative.   Respiratory: Negative.    Cardiovascular: Negative.   Gastrointestinal: Negative.   Neurological: Negative.   Psychiatric/Behavioral:  Positive for suicidal ideas. The patient is nervous/anxious.     Blood pressure (!) 150/77, pulse (!) 57, temperature 97.9 F (  36.6 C), resp. rate 16, height 6\' 4"  (1.93 m), weight 99.1 kg, SpO2 100%. Body mass index is 26.6 kg/m.   COGNITIVE FEATURES THAT CONTRIBUTE TO RISK:  Polarized thinking and Thought constriction (tunnel vision)    SUICIDE RISK:   Severe:  Frequent, intense, and enduring suicidal ideation, specific plan, no subjective intent, but some objective  markers of intent (i.e., choice of lethal method), the method is accessible, some limited preparatory behavior, evidence of impaired self-control, severe dysphoria/symptomatology, multiple risk factors present, and few if any protective factors, particularly a lack of social support.  Per H&P  I certify that inpatient services furnished can reasonably be expected to improve the patient's condition.   Lewanda Rife, MD

## 2023-04-06 NOTE — Plan of Care (Signed)
Patient uncooperative with treatment.

## 2023-04-07 NOTE — Plan of Care (Signed)
Pt is irritable. Pt refused all po medications except Depakote. Limited interaction with peers and staff. Walking up/down the hall. Slept at short intervals. Q 15 min checks provided. Denies SI/HI/AV/H. No c/o pain/discomfort noted.   Problem: Activity: Goal: Risk for activity intolerance will decrease Outcome: Progressing   Problem: Nutrition: Goal: Adequate nutrition will be maintained Outcome: Progressing   Problem: Coping: Goal: Level of anxiety will decrease Outcome: Progressing   Problem: Elimination: Goal: Will not experience complications related to bowel motility Outcome: Progressing Goal: Will not experience complications related to urinary retention Outcome: Progressing   Problem: Pain Managment: Goal: General experience of comfort will improve Outcome: Progressing

## 2023-04-07 NOTE — Progress Notes (Signed)
[facility] MD Progress Note  04/07/2023  Randy Shaffer  MRN:  [id number] Subjective: Patient seen today during rounds, chart reviewed, case discussed with RN.  Patient took 1 dose of Depakote yesterday, he had refused to take Depakote today.  Patient today reports that he does not think he needs medicine.  Patient said" I have not faith in doctors, I I would rather go and buy drugs on the street than take then take medicine in the  Hospital".  He further said that "I want to do analysis on medicine which will take about 2 weeks, and then I will decide if I want the medicine or not".  Involuntary commitment process and criteria for discharge from hospital discussed with patient.  Patient states that he continues to report.  He was argumentative.  "Musculoskeletal: Strength & Muscle Tone: within normal limits Gait & Station: normal Patient leans: N/A   Psychiatric Specialty Exam:   Presentation  General Appearance:  Casual   Eye Contact: Good   Speech: Clear and Coherent   Speech Volume: Normal   Handedness: Right     Mood and Affect  Mood: Angry   Affect: Labile, tearful, crying at time     Thought Process  Thought Processes: Circumstantial   Descriptions of Associations:Intact   Orientation:Full (Time, Place and Person)   Thought Content:Obsessions   History of Schizophrenia/Schizoaffective disorder:No   Hallucinations:None reported Ideas of Reference:None   Suicidal Thoughts:Yes, with a plan Homicidal Thoughts:Denies   Sensorium  Memory: Immediate Good   Judgment: Poor   Insight: Poor     Executive Functions  Concentration: Fair   Attention Span: Fair   Recall: Good   Fund of Knowledge: Good   Language: Good     Psychomotor Activity  Psychomotor Activity:Agitated   Assets  Assets: Desire for Improvement; Resilience     Sleep  Sleep:Poor     Physical Exam: HENT:     Head: Normocephalic.  Cardiovascular:     Rate and  Rhythm: Normal rate.  Pulmonary:     Effort: Pulmonary effort is normal.  Musculoskeletal:        General: Normal range of motion.     Cervical back: Normal range of motion.  Neurological:     Mental Status: He is alert and oriented to person, place, and time.  Psychiatric:        Mood and Affect: Mood normal.        Behavior: Behavior normal.        Thought Content: Thought content normal.        Judgment: Judgment normal.    Review of Systems  Constitutional: Negative.   HENT: Negative.    Eyes: Negative.   Respiratory: Negative.    Cardiovascular: Negative.   Gastrointestinal: Negative.   Neurological: Negative.   Psychiatric/Behavioral:  Positive for suicidal ideas. The patient is nervous/anxious.    Principal Problem: Suicidal ideation Diagnosis: Principal Problem:   Suicidal ideation Active Problems:   Suicidal ideations    Past Medical History:  Past Medical History:  Diagnosis Date   Anginal pain (HCC)    Aortic atherosclerosis (HCC)    BPH (benign prostatic hyperplasia)    CAD (coronary artery disease) 12/13/2021   a.) R/LHC 12/13/2021: EF 55-65%, 25% dLM-oLAD, 25% pLAD, 50% m-dLAD; mPA 20, mPCWP 10, Ao sat 96, PA sat 71, CO 5.7.   Cardiac murmur    Carotid artery disease (HCC)    a.) CTA neck 03/02/2022: 70% LICA; b.) PTA 04/19/2022: 80% LICA -->  POBA performed  followed by placement of a 9 mm prox, 7mm distal, 4 cm long exact stent placed; 10-15% residual stenosis; c.) carotid doppler 05/22/2022: 1-39% BICA   Complication of anesthesia    a.) PONV   Depression    Diet-controlled type 2 diabetes mellitus (HCC)    Diverticulosis 09/05/2011   DOE (dyspnea on exertion)    History of bilateral cataract extraction    HLD (hyperlipidemia)    Hypertension    Insomnia    a.) takes trazodone PRN   Long term current use of antithrombotics/antiplatelets    a.) on daily DAPT (ASA + clopidogrel)   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    OA (osteoarthritis) of knee    PONV  (postoperative nausea and vomiting)    SEVERE   Sleep apnea    a.) unable to tolerate nocturnal PAP therapy    Past Surgical History:  Procedure Laterality Date   AMPUTATION FINGER / THUMB Right    THUMB PLUS 3 RECONSTRUCTIVE SURGERIES   CAROTID PTA/STENT INTERVENTION Left 04/16/2022   Procedure: CAROTID PTA/STENT INTERVENTION;  Surgeon: Annice Needy, MD;  Location: [facility];  Service: Cardiovascular;  Laterality: Left;   COLONOSCOPY WITH PROPOFOL N/A 02/01/2023   Procedure: COLONOSCOPY WITH PROPOFOL;  Surgeon: Regis Bill, MD;  Location: [facility];  Service: Endoscopy;  Laterality: N/A;   CORONARY ANGIOPLASTY     HEAD LACERATION REPAIR     HERNIA REPAIR  09/24/1998   ONE AS INFANT   HIP NERVE RELEASE     HOLEP-LASER ENUCLEATION OF THE PROSTATE WITH MORCELLATION N/A 08/03/2022   Procedure: HOLEP-LASER ENUCLEATION OF THE PROSTATE WITH MORCELLATION;  Surgeon: Sondra Come, MD;  Location: [facility];  Service: Urology;  Laterality: N/A;   KNEE ARTHROSCOPY Right 09/24/2001   KNEE ARTHROSCOPY Right 02/23/2015   Procedure: ARTHROSCOPY KNEE WITH DEBRIDEMENT PARTIAL MEDIAL MENISECTOMY;  Surgeon: Christena Flake, MD;  Location: [facility];  Service: Orthopedics;  Laterality: Right;   PHOTOREFRACTIVE KERATOTOMY     RIGHT/LEFT HEART CATH AND CORONARY ANGIOGRAPHY N/A 12/13/2021   Procedure: RIGHT/LEFT HEART CATH AND CORONARY ANGIOGRAPHY;  Surgeon: Alwyn Pea, MD;  Location: [facility];  Service: Cardiovascular;  Laterality: N/A;   Family History:  Family History  Problem Relation Age of Onset   Heart disease Mother    Cancer Mother    Heart attack Father    Cancer Sister    Cancer Brother    Heart attack Maternal Uncle     Social History:  Social History   Substance and Sexual Activity  Alcohol Use Yes   Comment: 2-3 BEER/MONTH     Social History   Substance and Sexual Activity  Drug Use No    Social History   Socioeconomic  History   Marital status: Married    Spouse name: Olegario Messier   Number of children: Not on file   Years of education: Not on file   Highest education level: Not on file  Occupational History   Not on file  Tobacco Use   Smoking status: Never    Passive exposure: Never   Smokeless tobacco: Never  Vaping Use   Vaping status: Never Used  Substance and Sexual Activity   Alcohol use: Yes    Comment: 2-3 BEER/MONTH   Drug use: No   Sexual activity: Yes    Birth control/protection: None  Other Topics Concern   Not on file  Social History Narrative   Not on file  Social Determinants of Health   Financial Resource Strain: Patient Declined (12/24/2022)   Received from [facility], [facility]   Overall Financial Resource Strain (CARDIA)    Difficulty of Paying Living Expenses: Patient declined  Food Insecurity: No Food Insecurity (04/05/2023)   Hunger Vital Sign    Worried About Running Out of Food in the Last Year: Never true    Ran Out of Food in the Last Year: Never true  Transportation Needs: No Transportation Needs (04/05/2023)   PRAPARE - Administrator, Civil Service (Medical): No    Lack of Transportation (Non-Medical): No  Physical Activity: Not on file  Stress: Not on file  Social Connections: Not on file   Additional Social History:                         Sleep: Poor  Appetite:  Fair  Current Medications: Current Facility-Administered Medications  Medication Dose Route Frequency Provider Last Rate Last Admin   acetaminophen (TYLENOL) tablet 650 mg  650 mg Oral Q6H PRN Sindy Guadeloupe, NP       alum & mag hydroxide-simeth (MAALOX/MYLANTA) 200-200-20 MG/5ML suspension 30 mL  30 mL Oral Q4H PRN Bennett, Christal H, NP       aspirin EC tablet 81 mg  81 mg Oral Daily Bennett, Christal H, NP       atorvastatin (LIPITOR) tablet 20 mg  20 mg Oral QHS Bennett, Christal H, NP       clopidogrel (PLAVIX) tablet 75 mg  75 mg  Oral Daily Bennett, Christal H, NP       divalproex (DEPAKOTE) DR tablet 250 mg  250 mg Oral BID Lewanda Rife, MD   250 mg at 04/06/23 2135   hydrOXYzine (ATARAX) tablet 50 mg  50 mg Oral TID PRN Bennett, Christal H, NP       OLANZapine zydis (ZYPREXA) disintegrating tablet 10 mg  10 mg Oral Q8H PRN Sindy Guadeloupe, NP       And   LORazepam (ATIVAN) tablet 1 mg  1 mg Oral PRN Sindy Guadeloupe, NP       And   ziprasidone (GEODON) injection 20 mg  20 mg Intramuscular PRN Sindy Guadeloupe, NP       magnesium hydroxide (MILK OF MAGNESIA) suspension 30 mL  30 mL Oral Daily PRN Sindy Guadeloupe, NP       metoprolol succinate (TOPROL-XL) 24 hr tablet 12.5 mg  12.5 mg Oral Daily Bennett, Christal H, NP       mirtazapine (REMERON) tablet 7.5 mg  7.5 mg Oral QHS Lewanda Rife, MD       traZODone (DESYREL) tablet 25 mg  25 mg Oral QHS PRN Bennett, Christal H, NP        Lab Results: No results found for this or any previous visit (from the past 48 hour(s)).  Blood Alcohol level:  Lab Results  Component Value Date   ETH <10 04/03/2023    Metabolic Disorder Labs: No results found for: "HGBA1C", "MPG" No results found for: "PROLACTIN" No results found for: "CHOL", "TRIG", "HDL", "CHOLHDL", "VLDL", "LDLCALC"  Physical Findings: AIMS:  , ,  ,  ,    CIWA:    COWS:     Musculoskeletal: Strength & Muscle Tone: within normal limits Gait & Station: normal Patient leans: N/A   Psychiatric Specialty Exam:   Presentation  General Appearance:  Casual   Eye Contact: Good  Speech: Clear and Coherent   Speech Volume: Normal   Handedness: Right     Mood and Affect  Mood: " I do not know"   Affect: Anxious and irritable     Thought Process  Thought Processes: Circumstantial   Descriptions of Associations:Intact   Orientation:Full (Time, Place and Person)   Thought Content: Illogical   History of Schizophrenia/Schizoaffective disorder:No   Hallucinations:None  reported Ideas of Reference:None   Suicidal Thoughts:Yes, denies any intention or plan today Homicidal Thoughts:Denies   Sensorium  Memory: Immediate Good   Judgment: Poor   Insight: Poor     Executive Functions  Concentration: Fair   Attention Span: Fair   Recall: Good   Fund of Knowledge: Good   Language: Good     Psychomotor Activity  Psychomotor Activity:Agitated   Assets  Assets: Desire for Improvement; Resilience     Sleep  Sleep:Poor     Physical Exam: HENT:     Head: Normocephalic.  Cardiovascular:     Rate and Rhythm: Normal rate.  Pulmonary:     Effort: Pulmonary effort is normal.  Musculoskeletal:        General: Normal range of motion.     Cervical back: Normal range of motion.  Neurological:     Mental Status: He is alert and oriented to person, place, and time.  Psychiatric:        Mood and Affect: Mood normal.        Behavior: Behavior normal.        Thought Content: Thought content normal.        Judgment: Judgment normal.    Review of Systems  Constitutional: Negative.   HENT: Negative.    Eyes: Negative.   Respiratory: Negative.    Cardiovascular: Negative.   Gastrointestinal: Negative.   Neurological: Negative.   Psychiatric/Behavioral:  Positive for suicidal ideas. The patient is nervous/anxious.     Blood pressure (!) 152/74, pulse (!) 56, temperature 97.9 F (36.6 C), resp. rate 16, height 6\' 4"  (1.93 m), weight 99.1 kg, SpO2 100%. Body mass index is 26.6 kg/m.   Treatment Plan Summary: Daily contact with patient to assess and evaluate symptoms and progress in treatment and Medication management  Continue Depakote 250 mg by mouth twice daily for mood stabilization Remeron 7.5 mg by mouth at bedtime for depression and insomnia Patient was encouraged to take medicine as prescribed to initiation of avoid forced medication protocol  Lewanda Rife, MD

## 2023-04-07 NOTE — Group Note (Signed)
Date:  04/07/2023 Time:  11:26 AM  Group Topic/Focus:  Coping With Mental Health Crisis:   The purpose of this group is to help patients identify strategies for coping with mental health crisis.  Group discusses possible causes of crisis and ways to manage them effectively. Healthy Communication:   The focus of this group is to discuss communication, barriers to communication, as well as healthy ways to communicate with others. Self Care:   The focus of this group is to help patients understand the importance of self-care in order to improve or restore emotional, physical, spiritual, interpersonal, and financial health.    Participation Level:  Did Not Attend  Participation Quality:    Affect:    Cognitive:    Insight:   Engagement in Group:    Modes of Intervention:    Additional Comments:    Chevonne Bostrom L Clorinda Wyble 04/07/2023, 11:26 AM

## 2023-04-07 NOTE — Group Note (Signed)
LCSW Group Therapy Note  Group Date: 04/07/2023 Start Time: 1255 End Time: 1330   Type of Therapy and Topic:  Group Therapy - Healthy vs Unhealthy Coping Skills  Participation Level:  Did Not Attend   Description of Group The focus of this group was to determine what unhealthy coping techniques typically are used by group members and what healthy coping techniques would be helpful in coping with various problems. Patients were guided in becoming aware of the differences between healthy and unhealthy coping techniques. Patients were asked to identify 2-3 healthy coping skills they would like to learn to use more effectively.  Therapeutic Goals Patients learned that coping is what human beings do all day long to deal with various situations in their lives Patients defined and discussed healthy vs unhealthy coping techniques Patients identified their preferred coping techniques and identified whether these were healthy or unhealthy Patients determined 2-3 healthy coping skills they would like to become more familiar with and use more often. Patients provided support and ideas to each other   Summary of Patient Progress:  Patient did not attend group.   Marshell Levan, LCSWA 04/07/2023  2:33 PM

## 2023-04-07 NOTE — Plan of Care (Signed)
Patient continues to refuse meds. He is not eating or drinking. Patient is also pacing the unit.    Problem: Education: Goal: Knowledge of General Education information will improve Description: Including pain rating scale, medication(s)/side effects and non-pharmacologic comfort measures Outcome: Not Progressing   Problem: Health Behavior/Discharge Planning: Goal: Ability to manage health-related needs will improve Outcome: Not Progressing   Problem: Clinical Measurements: Goal: Ability to maintain clinical measurements within normal limits will improve Outcome: Not Progressing Goal: Will remain free from infection Outcome: Not Progressing Goal: Diagnostic test results will improve Outcome: Not Progressing Goal: Respiratory complications will improve Outcome: Not Progressing Goal: Cardiovascular complication will be avoided Outcome: Not Progressing   Problem: Activity: Goal: Risk for activity intolerance will decrease Outcome: Not Progressing   Problem: Self-Concept: Goal: Ability to disclose and discuss suicidal ideas will improve Outcome: Not Progressing Goal: Will verbalize positive feelings about self Outcome: Not Progressing   Problem: Medication: Goal: Compliance with prescribed medication regimen will improve Outcome: Not Progressing

## 2023-04-07 NOTE — Progress Notes (Signed)
   04/07/23 0734  Psych Admission Type (Psych Patients Only)  Admission Status Involuntary  Psychosocial Assessment  Patient Complaints Irritability  Eye Contact Fair  Facial Expression Flat  Affect Anxious  Speech Unremarkable  Interaction Isolative;Minimal  Motor Activity Slow  Appearance/Hygiene Unremarkable  Behavior Characteristics Pacing  Mood Anxious  Thought Process  Coherency Circumstantial  Content Blaming others  Delusions None reported or observed  Perception WDL  Hallucination None reported or observed  Judgment Impaired  Confusion None  Danger to Self  Current suicidal ideation? Denies  Danger to Others  Danger to Others None reported or observed

## 2023-04-08 MED ORDER — LORAZEPAM 1 MG PO TABS
1.0000 mg | ORAL_TABLET | Freq: Once | ORAL | Status: AC
  Administered 2023-04-08: 1 mg via ORAL
  Filled 2023-04-08: qty 1

## 2023-04-08 NOTE — Progress Notes (Signed)
Patient laying in bed when RN entered patient's room.  RN introduced herself and informed patient he had medications ordered for this morning.  Patient stated, "No."  RN went though each medication and why it was ordered to which patient repeated, "No."  Patent did not turn over to even look at this Clinical research associate.    Patient refused breakfast and has not left his room. 15 min checks in place for safety. Patient isolates to room.  No interaction with peers or staff.   After treatment team meeting,  patient was agreeable to taking Ativan and Depakote. Patient also agreeable to calling his wife and  gave her his code.   Patient came to dayroom for snack and dinner.

## 2023-04-08 NOTE — Group Note (Signed)
LCSW Group Therapy Note  Group Date: 04/08/2023 Start Time: 1315 End Time: 1400   Type of Therapy and Topic:  Group Therapy - Healthy vs Unhealthy Coping Skills  Participation Level:  Did Not Attend   Description of Group The focus of this group was to determine what unhealthy coping techniques typically are used by group members and what healthy coping techniques would be helpful in coping with various problems. Patients were guided in becoming aware of the differences between healthy and unhealthy coping techniques. Patients were asked to identify 2-3 healthy coping skills they would like to learn to use more effectively.  Therapeutic Goals Patients learned that coping is what human beings do all day long to deal with various situations in their lives Patients defined and discussed healthy vs unhealthy coping techniques Patients identified their preferred coping techniques and identified whether these were healthy or unhealthy Patients determined 2-3 healthy coping skills they would like to become more familiar with and use more often. Patients provided support and ideas to each other   Summary of Patient Progress: X   Therapeutic Modalities Cognitive Behavioral Therapy Motivational Interviewing  Elza Rafter, [place] 04/08/2023  3:31 PM

## 2023-04-08 NOTE — Group Note (Signed)
Date:  04/08/2023 Time:  8:46 PM  Group Topic/Focus:  Movie therapy     Participation Level:  Active  Participation Quality:  Appropriate  Affect:  Appropriate  Cognitive:  Alert  Insight: Appropriate  Engagement in Group:  Engaged  Modes of Intervention:  Exploration  Additional Comments:    Garry Heater 04/08/2023, 8:46 PM

## 2023-04-08 NOTE — BH IP Treatment Plan (Signed)
Interdisciplinary Treatment and Diagnostic Plan Update  04/08/2023 Time of Session: 9:51 AM  Randy Shaffer MRN: [id number]  Principal Diagnosis: Suicidal ideation  Secondary Diagnoses: Principal Problem:   Suicidal ideation Active Problems:   Suicidal ideations   Current Medications:  Current Facility-Administered Medications  Medication Dose Route Frequency Provider Last Rate Last Admin   acetaminophen (TYLENOL) tablet 650 mg  650 mg Oral Q6H PRN Sindy Guadeloupe, NP       alum & mag hydroxide-simeth (MAALOX/MYLANTA) 200-200-20 MG/5ML suspension 30 mL  30 mL Oral Q4H PRN Bennett, Christal H, NP       aspirin EC tablet 81 mg  81 mg Oral Daily Bennett, Christal H, NP       atorvastatin (LIPITOR) tablet 20 mg  20 mg Oral QHS Bennett, Christal H, NP       clopidogrel (PLAVIX) tablet 75 mg  75 mg Oral Daily Bennett, Christal H, NP       divalproex (DEPAKOTE) DR tablet 250 mg  250 mg Oral BID Lewanda Rife, MD   250 mg at 04/08/23 1017   hydrOXYzine (ATARAX) tablet 50 mg  50 mg Oral TID PRN Bennett, Christal H, NP       OLANZapine zydis (ZYPREXA) disintegrating tablet 10 mg  10 mg Oral Q8H PRN Sindy Guadeloupe, NP       And   LORazepam (ATIVAN) tablet 1 mg  1 mg Oral PRN Sindy Guadeloupe, NP       And   ziprasidone (GEODON) injection 20 mg  20 mg Intramuscular PRN Sindy Guadeloupe, NP       magnesium hydroxide (MILK OF MAGNESIA) suspension 30 mL  30 mL Oral Daily PRN Sindy Guadeloupe, NP       metoprolol succinate (TOPROL-XL) 24 hr tablet 12.5 mg  12.5 mg Oral Daily Bennett, Christal H, NP       mirtazapine (REMERON) tablet 7.5 mg  7.5 mg Oral QHS Lewanda Rife, MD   7.5 mg at 04/07/23 2151   traZODone (DESYREL) tablet 25 mg  25 mg Oral QHS PRN Bennett, Christal H, NP       PTA Medications: Medications Prior to Admission  Medication Sig Dispense Refill Last Dose   albuterol (VENTOLIN HFA) 108 (90 Base) MCG/ACT inhaler Inhale into the lungs.      aspirin EC 81 MG tablet Take 81 mg by mouth  daily. Swallow whole.      atorvastatin (LIPITOR) 40 MG tablet Take 20 mg by mouth in the morning.      Budeson-Glycopyrrol-Formoterol (BREZTRI AEROSPHERE) 160-9-4.8 MCG/ACT AERO Inhale into the lungs. Inhale 2 inhalations into the lungs 2 (two) times daily      clopidogrel (PLAVIX) 75 MG tablet TAKE 1 TABLET BY MOUTH EVERY DAY 90 tablet 2    FLUoxetine (PROZAC) 20 MG capsule Take 40 mg by mouth every morning. (Patient not taking: Reported on 04/03/2023)      isosorbide mononitrate (IMDUR) 30 MG 24 hr tablet Take 30 mg by mouth daily.      metoprolol succinate (TOPROL-XL) 25 MG 24 hr tablet Take 12.5 mg by mouth in the morning and at bedtime. (Patient not taking: Reported on 04/03/2023)      PARoxetine (PAXIL) 20 MG tablet TAKE 1/2 TAB DAILY FOR A WEEK THEN INCREASE TO 1 TAB DAILY AND CONTINUE THAT DOSE      tamsulosin (FLOMAX) 0.4 MG CAPS capsule Take 0.4 mg by mouth daily. (Patient not taking: Reported on 04/03/2023)      traZODone (DESYREL)  50 MG tablet TAKE 1.5-2 TABS BY MOUTH AT NIGHT FOR SLEEP (Patient not taking: Reported on 04/03/2023)       Patient Stressors: Marital or family conflict    Patient Strengths: Capable of independent living  Forensic psychologist fund of knowledge   Treatment Modalities: Medication Management, Group therapy, Case management,  1 to 1 session with clinician, Psychoeducation, Recreational therapy.   Physician Treatment Plan for Primary Diagnosis: Suicidal ideation Long Term Goal(s): Improvement in symptoms so as ready for discharge   Short Term Goals: Ability to identify changes in lifestyle to reduce recurrence of condition will improve Ability to verbalize feelings will improve Ability to disclose and discuss suicidal ideas Ability to demonstrate self-control will improve Ability to identify and develop effective coping behaviors will improve Compliance with prescribed medications will improve Ability to identify triggers associated with  substance abuse/mental health issues will improve  Medication Management: Evaluate patient's response, side effects, and tolerance of medication regimen.  Therapeutic Interventions: 1 to 1 sessions, Unit Group sessions and Medication administration.  Evaluation of Outcomes: Not Met  Physician Treatment Plan for Secondary Diagnosis: Principal Problem:   Suicidal ideation Active Problems:   Suicidal ideations  Long Term Goal(s): Improvement in symptoms so as ready for discharge   Short Term Goals: Ability to identify changes in lifestyle to reduce recurrence of condition will improve Ability to verbalize feelings will improve Ability to disclose and discuss suicidal ideas Ability to demonstrate self-control will improve Ability to identify and develop effective coping behaviors will improve Compliance with prescribed medications will improve Ability to identify triggers associated with substance abuse/mental health issues will improve     Medication Management: Evaluate patient's response, side effects, and tolerance of medication regimen.  Therapeutic Interventions: 1 to 1 sessions, Unit Group sessions and Medication administration.  Evaluation of Outcomes: Not Met   RN Treatment Plan for Primary Diagnosis: Suicidal ideation Long Term Goal(s): Knowledge of disease and therapeutic regimen to maintain health will improve  Short Term Goals: Ability to remain free from injury will improve, Ability to verbalize frustration and anger appropriately will improve, Ability to demonstrate self-control, Ability to participate in decision making will improve, Ability to verbalize feelings will improve, Ability to disclose and discuss suicidal ideas, Ability to identify and develop effective coping behaviors will improve, and Compliance with prescribed medications will improve  Medication Management: RN will administer medications as ordered by provider, will assess and evaluate patient's  response and provide education to patient for prescribed medication. RN will report any adverse and/or side effects to prescribing provider.  Therapeutic Interventions: 1 on 1 counseling sessions, Psychoeducation, Medication administration, Evaluate responses to treatment, Monitor vital signs and CBGs as ordered, Perform/monitor CIWA, COWS, AIMS and Fall Risk screenings as ordered, Perform wound care treatments as ordered.  Evaluation of Outcomes: Not Met   LCSW Treatment Plan for Primary Diagnosis: Suicidal ideation Long Term Goal(s): Safe transition to appropriate next level of care at discharge, Engage patient in therapeutic group addressing interpersonal concerns.  Short Term Goals: Engage patient in aftercare planning with referrals and resources, Increase social support, Increase ability to appropriately verbalize feelings, Increase emotional regulation, Facilitate acceptance of mental health diagnosis and concerns, Facilitate patient progression through stages of change regarding substance use diagnoses and concerns, and Increase skills for wellness and recovery  Therapeutic Interventions: Assess for all discharge needs, 1 to 1 time with Social worker, Explore available resources and support systems, Assess for adequacy in community support network, Educate family and  significant other(s) on suicide prevention, Complete Psychosocial Assessment, Interpersonal group therapy.  Evaluation of Outcomes: Not Met   Progress in Treatment: Attending groups: No. Participating in groups: No. Taking medication as prescribed: Yes. and No. Toleration medication: Yes. Family/Significant other contact made: Yes, individual(s) contacted:  pt's wife Randy Shaffer [phone] Patient understands diagnosis: Yes. Discussing patient identified problems/goals with staff: Yes. Medical problems stabilized or resolved: Yes. Denies suicidal/homicidal ideation: Yes. Issues/concerns per patient self-inventory:  No. Other: None   New problem(s) identified: No, Describe:  None identified.   New Short Term/Long Term Goal(s):elimination of symptoms of psychosis, medication management for mood stabilization; elimination of SI thoughts; development of comprehensive mental wellness plan.   Patient Goals:  "To get out and try and get myself back together"  Discharge Plan or Barriers: CSW will assist with appropriate discharge planning   Reason for Continuation of Hospitalization: Depression Medication stabilization  Estimated Length of Stay: 1 to 7 days   Last 3 [place] Suicide Severity Risk Score: Flowsheet Row Admission (Current) from 04/05/2023 in [facility] BE[facility] ED from 04/03/2023 in [facility] Emergency Department at [facility] Admission (Discharged) from 02/01/2023 in [facility]  C-SSRS RISK CATEGORY No Risk High Risk No Risk       Last PHQ 2/9 Scores:     No data to display          Scribe for Treatment Team: Elza Rafter, Theresia Majors 04/08/2023 11:56 AM

## 2023-04-08 NOTE — Plan of Care (Signed)
Pt is irritable and verbally abusive towards staff. Angry mood. Isolates in room. Refused Lipitor.  Denies SI/HI/AV/H. Q 15 min checks provided. No c/o pain/discomfort noted.    Problem: Coping: Goal: Level of anxiety will decrease Outcome: Progressing   Problem: Elimination: Goal: Will not experience complications related to bowel motility Outcome: Progressing Goal: Will not experience complications related to urinary retention Outcome: Progressing   Problem: Pain Managment: Goal: General experience of comfort will improve Outcome: Progressing   Problem: Safety: Goal: Ability to remain free from injury will improve Outcome: Progressing

## 2023-04-08 NOTE — Plan of Care (Signed)
  Problem: Education: Goal: Knowledge of General Education information will improve Description: Including pain rating scale, medication(s)/side effects and non-pharmacologic comfort measures Outcome: Progressing   Problem: Coping: Goal: Coping ability will improve Outcome: Progressing   Problem: Medication: Goal: Compliance with prescribed medication regimen will improve Outcome: Progressing
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
## 2023-04-08 NOTE — Progress Notes (Signed)
   04/08/23 2100  Psych Admission Type (Psych Patients Only)  Admission Status Involuntary  Psychosocial Assessment  Patient Complaints Irritability  Eye Contact Fair  Facial Expression Animated  Affect Irritable  Speech Logical/coherent  Interaction Assertive  Motor Activity Pacing  Appearance/Hygiene Unremarkable  Behavior Characteristics Cooperative;Calm  Mood Irritable  Thought Process  Coherency WDL  Content Blaming others  Delusions None reported or observed  Perception WDL  Hallucination None reported or observed  Judgment Impaired  Confusion None

## 2023-04-08 NOTE — Progress Notes (Signed)
[facility] MD Progress Note  04/08/2023  Randy Shaffer  MRN:  [id number]  Subjective: Patient seen today during treatment team with social worker and RN.  Patient was tearful throughout the session.  He talks about his father's death.  Patient mentioned that he feels guilty and blames himself for his father's death because he took him out for hunting knowing that he father has a cardiac condition.  Patient was provided with reassurance and support.  Patient is still reluctant to take medicine as prescribed.  He was encouraged to take the medicine.  Involuntary commitment process and criteria for discharge from hospital discussed with patient.  Patient is ambivalent about his relationship with his wife.  Patient was encouraged to focus on short-term goals.  He was encouraged to attend groups and work on coping strategies as well as on a safe discharge plan.  We discussed having a family session via phone in few days upon stabilization of patient's mood.  Patient agrees to take Depakote and one-time dose of Ativan for mood stabilization and anxiety  Principal Problem: Suicidal ideation Diagnosis: Principal Problem:   Suicidal ideation Active Problems:   Suicidal ideations Mood d/o R/O Bipolar disorder   Past Medical History:  Past Medical History:  Diagnosis Date   Anginal pain (HCC)    Aortic atherosclerosis (HCC)    BPH (benign prostatic hyperplasia)    CAD (coronary artery disease) 12/13/2021   a.) R/LHC 12/13/2021: EF 55-65%, 25% dLM-oLAD, 25% pLAD, 50% m-dLAD; mPA 20, mPCWP 10, Ao sat 96, PA sat 71, CO 5.7.   Cardiac murmur    Carotid artery disease (HCC)    a.) CTA neck 03/02/2022: 70% LICA; b.) PTA 04/19/2022: 80% LICA --> POBA performed  followed by placement of a 9 mm prox, 7mm distal, 4 cm long exact stent placed; 10-15% residual stenosis; c.) carotid doppler 05/22/2022: 1-39% BICA   Complication of anesthesia    a.) PONV   Depression    Diet-controlled type 2 diabetes mellitus (HCC)     Diverticulosis 09/05/2011   DOE (dyspnea on exertion)    History of bilateral cataract extraction    HLD (hyperlipidemia)    Hypertension    Insomnia    a.) takes trazodone PRN   Long term current use of antithrombotics/antiplatelets    a.) on daily DAPT (ASA + clopidogrel)   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    OA (osteoarthritis) of knee    PONV (postoperative nausea and vomiting)    SEVERE   Sleep apnea    a.) unable to tolerate nocturnal PAP therapy    Past Surgical History:  Procedure Laterality Date   AMPUTATION FINGER / THUMB Right    THUMB PLUS 3 RECONSTRUCTIVE SURGERIES   CAROTID PTA/STENT INTERVENTION Left 04/16/2022   Procedure: CAROTID PTA/STENT INTERVENTION;  Surgeon: Annice Needy, MD;  Location: [facility];  Service: Cardiovascular;  Laterality: Left;   COLONOSCOPY WITH PROPOFOL N/A 02/01/2023   Procedure: COLONOSCOPY WITH PROPOFOL;  Surgeon: Regis Bill, MD;  Location: [facility];  Service: Endoscopy;  Laterality: N/A;   CORONARY ANGIOPLASTY     HEAD LACERATION REPAIR     HERNIA REPAIR  09/24/1998   ONE AS INFANT   HIP NERVE RELEASE     HOLEP-LASER ENUCLEATION OF THE PROSTATE WITH MORCELLATION N/A 08/03/2022   Procedure: HOLEP-LASER ENUCLEATION OF THE PROSTATE WITH MORCELLATION;  Surgeon: Sondra Come, MD;  Location: [facility] OR[facility];  Service: Urology;  Laterality: N/A;   KNEE ARTHROSCOPY Right 09/24/2001  KNEE ARTHROSCOPY Right 02/23/2015   Procedure: ARTHROSCOPY KNEE WITH DEBRIDEMENT PARTIAL MEDIAL MENISECTOMY;  Surgeon: Christena Flake, MD;  Location: [facility];  Service: Orthopedics;  Laterality: Right;   PHOTOREFRACTIVE KERATOTOMY     RIGHT/LEFT HEART CATH AND CORONARY ANGIOGRAPHY N/A 12/13/2021   Procedure: RIGHT/LEFT HEART CATH AND CORONARY ANGIOGRAPHY;  Surgeon: Alwyn Pea, MD;  Location: [facility];  Service: Cardiovascular;  Laterality: N/A;   Family History:  Family History  Problem Relation Age of Onset    Heart disease Mother    Cancer Mother    Heart attack Father    Cancer Sister    Cancer Brother    Heart attack Maternal Uncle     Social History:  Social History   Substance and Sexual Activity  Alcohol Use Yes   Comment: 2-3 BEER/MONTH     Social History   Substance and Sexual Activity  Drug Use No    Social History   Socioeconomic History   Marital status: Married    Spouse name: Olegario Messier   Number of children: Not on file   Years of education: Not on file   Highest education level: Not on file  Occupational History   Not on file  Tobacco Use   Smoking status: Never    Passive exposure: Never   Smokeless tobacco: Never  Vaping Use   Vaping status: Never Used  Substance and Sexual Activity   Alcohol use: Yes    Comment: 2-3 BEER/MONTH   Drug use: No   Sexual activity: Yes    Birth control/protection: None  Other Topics Concern   Not on file  Social History Narrative   Not on file   Social Determinants of Health   Financial Resource Strain: Patient Declined (12/24/2022)   Received from [facility], [facility]   Overall Financial Resource Strain (CARDIA)    Difficulty of Paying Living Expenses: Patient declined  Food Insecurity: No Food Insecurity (04/05/2023)   Hunger Vital Sign    Worried About Running Out of Food in the Last Year: Never true    Ran Out of Food in the Last Year: Never true  Transportation Needs: No Transportation Needs (04/05/2023)   PRAPARE - Administrator, Civil Service (Medical): No    Lack of Transportation (Non-Medical): No  Physical Activity: Not on file  Stress: Not on file  Social Connections: Not on file   Additional Social History:                         Sleep: Poor  Appetite:  Fair  Current Medications: Current Facility-Administered Medications  Medication Dose Route Frequency Provider Last Rate Last Admin   acetaminophen (TYLENOL) tablet 650 mg  650 mg Oral Q6H  PRN Sindy Guadeloupe, NP       alum & mag hydroxide-simeth (MAALOX/MYLANTA) 200-200-20 MG/5ML suspension 30 mL  30 mL Oral Q4H PRN Bennett, Christal H, NP       aspirin EC tablet 81 mg  81 mg Oral Daily Bennett, Christal H, NP       atorvastatin (LIPITOR) tablet 20 mg  20 mg Oral QHS Bennett, Christal H, NP       clopidogrel (PLAVIX) tablet 75 mg  75 mg Oral Daily Bennett, Christal H, NP       divalproex (DEPAKOTE) DR tablet 250 mg  250 mg Oral BID Lewanda Rife, MD   250 mg at 04/08/23 1017  hydrOXYzine (ATARAX) tablet 50 mg  50 mg Oral TID PRN Bennett, Christal H, NP       OLANZapine zydis (ZYPREXA) disintegrating tablet 10 mg  10 mg Oral Q8H PRN Sindy Guadeloupe, NP       And   LORazepam (ATIVAN) tablet 1 mg  1 mg Oral PRN Sindy Guadeloupe, NP       And   ziprasidone (GEODON) injection 20 mg  20 mg Intramuscular PRN Sindy Guadeloupe, NP       magnesium hydroxide (MILK OF MAGNESIA) suspension 30 mL  30 mL Oral Daily PRN Sindy Guadeloupe, NP       metoprolol succinate (TOPROL-XL) 24 hr tablet 12.5 mg  12.5 mg Oral Daily Bennett, Christal H, NP       mirtazapine (REMERON) tablet 7.5 mg  7.5 mg Oral QHS Lewanda Rife, MD   7.5 mg at 04/07/23 2151   traZODone (DESYREL) tablet 25 mg  25 mg Oral QHS PRN Bennett, Christal H, NP        Lab Results: No results found for this or any previous visit (from the past 48 hour(s)).  Blood Alcohol level:  Lab Results  Component Value Date   ETH <10 04/03/2023    Metabolic Disorder Labs: No results found for: "HGBA1C", "MPG" No results found for: "PROLACTIN" No results found for: "CHOL", "TRIG", "HDL", "CHOLHDL", "VLDL", "LDLCALC"  Physical Findings: AIMS:  , ,  ,  ,    CIWA:    COWS:     Musculoskeletal: Strength & Muscle Tone: within normal limits Gait & Station: normal Patient leans: N/A   Psychiatric Specialty Exam:   Presentation  General Appearance:  Casual   Eye Contact: Good   Speech: Clear and Coherent   Speech  Volume: Normal   Handedness: Right     Mood and Affect  Mood: " I anxious"   Affect: Anxious and tearful     Thought Process  Thought Processes: Circumstantial   Descriptions of Associations:Intact   Orientation:Full (Time, Place and Person)   Thought Content: Illogical   History of Schizophrenia/Schizoaffective disorder:No   Hallucinations:None reported Ideas of Reference:None   Suicidal Thoughts:Yes, denies any intention or plan today Homicidal Thoughts:Denies   Sensorium  Memory: Immediate Good   Judgment: Poor   Insight: Poor     Executive Functions  Concentration: Fair   Attention Span: Fair   Recall: Good   Fund of Knowledge: Good   Language: Good     Psychomotor Activity  Psychomotor Activity:Agitated   Assets  Assets: Desire for Improvement; Resilience     Sleep  Sleep:Poor     Physical Exam: HENT:     Head: Normocephalic.  Cardiovascular:     Rate and Rhythm: Normal rate.  Pulmonary:     Effort: Pulmonary effort is normal.  Musculoskeletal:        General: Normal range of motion.     Cervical back: Normal range of motion.  Neurological:     Mental Status: He is alert and oriented to person, place, and time.  Psychiatric:        Mood and Affect: Mood normal.        Behavior: Behavior normal.        Thought Content: Thought content normal.        Judgment: Judgment normal.    Review of Systems  Constitutional: Negative.   HENT: Negative.    Eyes: Negative.   Respiratory: Negative.    Cardiovascular: Negative.   Gastrointestinal: Negative.  Neurological: Negative.   Psychiatric/Behavioral:  Positive for suicidal ideas. The patient is nervous/anxious.     Blood pressure (!) 144/76, pulse (!) 53, temperature 98.3 F (36.8 C), resp. rate 16, height 6\' 4"  (1.93 m), weight 99.1 kg, SpO2 100%. Body mass index is 26.6 kg/m.   Treatment Plan Summary: Daily contact with patient to assess and evaluate symptoms  and progress in treatment and Medication management  Continue Depakote 250 mg by mouth twice daily for mood stabilization Remeron 7.5 mg by mouth at bedtime for depression and insomnia Patient was encouraged to take medicine as prescribed to initiation of avoid forced medication protocol  First Opinion to initiate Forced medicine protocol, done 04/08/23   Reason for the Medication: The patient, without the benefit of the specific treatment measure, is incapable of participating in any available treatment plan that will give the patient a realistic opportunity of improving the patient's condition.       On interview the patient cannot articulate an understanding of her psychiatric diagnosis, and cannot discuss risks/benefits to her medications.     The patient currently is too symptomatic to engage in meaningful conversations regarding the treatment of her psychiatric illness and is unlikely to improve without administration of forced medications.  He is not able to participate in a meaningful way in decisions regarding medications or treatment. At this time I am in agreement that the patient needs to continue his psychotropic medicine in order to have a realistic expectation for improving his symptoms. It is my opinion that should the patient refuse medications that this would lead to more risk of potential self-harm than if she was on a protocol for medications against objection.  Patient currently is not able to make decision about need of treatment with medications.  Patient benefit from medication treatment which will be forced as he is incapable of participating advisable treatment plan.  I strongly believe that his symptoms will persist without administration of medications resulting in increased disability to patient and prolonging inpatient admission.  Based on my overall assessment, I believe the benefits of administering medication to patient (who  lacks capacity to consent to medications)  significantly outweigh the risks.    Lewanda Rife, MD

## 2023-04-08 NOTE — Plan of Care (Signed)
  Problem: Education: Goal: Knowledge of General Education information will improve Description: Including pain rating scale, medication(s)/side effects and non-pharmacologic comfort measures Outcome: Progressing   Problem: Activity: Goal: Risk for activity intolerance will decrease Outcome: Progressing   Problem: Nutrition: Goal: Adequate nutrition will be maintained Outcome: Progressing   Problem: Coping: Goal: Level of anxiety will decrease Outcome: Progressing   Problem: Coping: Goal: Coping ability will improve Outcome: Progressing   Problem: Medication: Goal: Compliance with prescribed medication regimen will improve Outcome: Progressing

## 2023-04-08 NOTE — Group Note (Signed)
Recreation Therapy Group Note   Group Topic:Emotion Expression  Group Date: 04/08/2023 Start Time: 1400 End Time: 1455 Facilitators: Rosina Lowenstein, LRT, CTRS Location:  Dayroom  Group Description: Positivity Collage. LRT and patients discussed the importance of having a positive mindset and being happy. Patients received magazines, safety scissors, a glue stick and a piece of paper. Pts were encouraged to find images or words in the magazines that showed "happiness" or positivity to them. Pt shared their collage with the group once they were finished. LRT and pts discussed how it can be difficult to always have a positive mindset, especially when they have mental health challenges.    Goal Area(s) Addressed:  Pt will identify things associate with positivity. Pt will reduce negative thinking. Pt will identify a new coping skill of thinking positive thoughts.     Affect/Mood: N/A   Participation Level: Did not attend    Clinical Observations/Individualized Feedback: Randy Shaffer did not attend group.  Plan: Continue to engage patient in RT group sessions 2-3x/week.   Rosina Lowenstein, LRT, CTRS 04/08/2023 3:51 PM

## 2023-04-08 NOTE — Group Note (Signed)
Date:  04/08/2023 Time:  10:40 AM  Group Topic/Focus:  Rediscovering Joy:   The focus of this group is to explore various ways to relieve stress in a positive manner.    Participation Level:  Did Not Attend  Participation Quality:    Affect:    Cognitive:    Insight:   Engagement in Group:    Modes of Intervention:    Additional Comments:    [name] 04/08/2023, 10:40 AM

## 2023-04-09 DIAGNOSIS — R45851 Suicidal ideations: Secondary | ICD-10-CM | POA: Diagnosis not present

## 2023-04-09 MED ORDER — MIRTAZAPINE 15 MG PO TABS
15.0000 mg | ORAL_TABLET | Freq: Every day | ORAL | Status: DC
  Administered 2023-04-09 – 2023-04-10 (×2): 15 mg via ORAL
  Filled 2023-04-09 (×2): qty 1

## 2023-04-09 NOTE — Progress Notes (Signed)
[facility] MD Progress Note  04/09/2023 11:13 AM Randy Shaffer  MRN:  [id number] Subjective: Randy Shaffer is seen on rounds today.  He says that he is feeling a little bit better.  He is no longer having any suicidal thoughts.  He states that his wife was getting on his nerves so he walked out to the woods.  He denies having any firearms with him.  He says that he has spoke to his wife a few times since he has been here and that has gone well.  He is doing okay on his current medications and I talked to him about going up on his Remeron.  Been on Paxil and Prozac in the past. Principal Problem: Suicidal ideation Diagnosis: Principal Problem:   Suicidal ideation Active Problems:   Suicidal ideations  Total Time spent with patient: 15 minutes  Past Psychiatric History: Depression  Past Medical History:  Past Medical History:  Diagnosis Date   Anginal pain (HCC)    Aortic atherosclerosis (HCC)    BPH (benign prostatic hyperplasia)    CAD (coronary artery disease) 12/13/2021   a.) R/LHC 12/13/2021: EF 55-65%, 25% dLM-oLAD, 25% pLAD, 50% m-dLAD; mPA 20, mPCWP 10, Ao sat 96, PA sat 71, CO 5.7.   Cardiac murmur    Carotid artery disease (HCC)    a.) CTA neck 03/02/2022: 70% LICA; b.) PTA 04/19/2022: 80% LICA --> POBA performed  followed by placement of a 9 mm prox, 7mm distal, 4 cm long exact stent placed; 10-15% residual stenosis; c.) carotid doppler 05/22/2022: 1-39% BICA   Complication of anesthesia    a.) PONV   Depression    Diet-controlled type 2 diabetes mellitus (HCC)    Diverticulosis 09/05/2011   DOE (dyspnea on exertion)    History of bilateral cataract extraction    HLD (hyperlipidemia)    Hypertension    Insomnia    a.) takes trazodone PRN   Long term current use of antithrombotics/antiplatelets    a.) on daily DAPT (ASA + clopidogrel)   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    OA (osteoarthritis) of knee    PONV (postoperative nausea and vomiting)    SEVERE   Sleep apnea    a.) unable to  tolerate nocturnal PAP therapy    Past Surgical History:  Procedure Laterality Date   AMPUTATION FINGER / THUMB Right    THUMB PLUS 3 RECONSTRUCTIVE SURGERIES   CAROTID PTA/STENT INTERVENTION Left 04/16/2022   Procedure: CAROTID PTA/STENT INTERVENTION;  Surgeon: Annice Needy, MD;  Location: [facility];  Service: Cardiovascular;  Laterality: Left;   COLONOSCOPY WITH PROPOFOL N/A 02/01/2023   Procedure: COLONOSCOPY WITH PROPOFOL;  Surgeon: Regis Bill, MD;  Location: [facility];  Service: Endoscopy;  Laterality: N/A;   CORONARY ANGIOPLASTY     HEAD LACERATION REPAIR     HERNIA REPAIR  09/24/1998   ONE AS INFANT   HIP NERVE RELEASE     HOLEP-LASER ENUCLEATION OF THE PROSTATE WITH MORCELLATION N/A 08/03/2022   Procedure: HOLEP-LASER ENUCLEATION OF THE PROSTATE WITH MORCELLATION;  Surgeon: Sondra Come, MD;  Location: [facility] OR[facility];  Service: Urology;  Laterality: N/A;   KNEE ARTHROSCOPY Right 09/24/2001   KNEE ARTHROSCOPY Right 02/23/2015   Procedure: ARTHROSCOPY KNEE WITH DEBRIDEMENT PARTIAL MEDIAL MENISECTOMY;  Surgeon: Christena Flake, MD;  Location: [facility];  Service: Orthopedics;  Laterality: Right;   PHOTOREFRACTIVE KERATOTOMY     RIGHT/LEFT HEART CATH AND CORONARY ANGIOGRAPHY N/A 12/13/2021   Procedure: RIGHT/LEFT HEART CATH  AND CORONARY ANGIOGRAPHY;  Surgeon: Alwyn Pea, MD;  Location: [facility];  Service: Cardiovascular;  Laterality: N/A;   Family History:  Family History  Problem Relation Age of Onset   Heart disease Mother    Cancer Mother    Heart attack Father    Cancer Sister    Cancer Brother    Heart attack Maternal Uncle    Family Psychiatric  History: Unremarkable Social History:  Social History   Substance and Sexual Activity  Alcohol Use Yes   Comment: 2-3 BEER/MONTH     Social History   Substance and Sexual Activity  Drug Use No    Social History   Socioeconomic History   Marital status: Married     Spouse name: Randy Shaffer   Number of children: Not on file   Years of education: Not on file   Highest education level: Not on file  Occupational History   Not on file  Tobacco Use   Smoking status: Never    Passive exposure: Never   Smokeless tobacco: Never  Vaping Use   Vaping status: Never Used  Substance and Sexual Activity   Alcohol use: Yes    Comment: 2-3 BEER/MONTH   Drug use: No   Sexual activity: Yes    Birth control/protection: None  Other Topics Concern   Not on file  Social History Narrative   Not on file   Social Determinants of Health   Financial Resource Strain: Patient Declined (12/24/2022)   Received [facility]   Overall Financial Resource Strain (CARDIA)    Difficulty of Paying Living Expenses: Patient declined  Food Insecurity: No Food Insecurity (04/05/2023)   Hunger Vital Sign    Worried About Running Out of Food in the Last Year: Never true    Ran Out of Food in the Last Year: Never true  Transportation Needs: No Transportation Needs (04/05/2023)   PRAPARE - Administrator, Civil Service (Medical): No    Lack of Transportation (Non-Medical): No  Physical Activity: Not on file  Stress: Not on file  Social Connections: Not on file   Additional Social History:                         Sleep: Fair  Appetite:  Fair  Current Medications: Current Facility-Administered Medications  Medication Dose Route Frequency Provider Last Rate Last Admin   acetaminophen (TYLENOL) tablet 650 mg  650 mg Oral Q6H PRN Sindy Guadeloupe, NP       alum & mag hydroxide-simeth (MAALOX/MYLANTA) 200-200-20 MG/5ML suspension 30 mL  30 mL Oral Q4H PRN Bennett, Christal H, NP       aspirin EC tablet 81 mg  81 mg Oral Daily Bennett, Christal H, NP   81 mg at 04/09/23 0920   atorvastatin (LIPITOR) tablet 20 mg  20 mg Oral QHS Bennett, Christal H, NP   20 mg at 04/08/23 2107   clopidogrel (PLAVIX) tablet 75 mg  75  mg Oral Daily Bennett, Christal H, NP   75 mg at 04/09/23 0920   divalproex (DEPAKOTE) DR tablet 250 mg  250 mg Oral BID Lewanda Rife, MD   250 mg at 04/09/23 0920   hydrOXYzine (ATARAX) tablet 50 mg  50 mg Oral TID PRN Bennett, Christal H, NP       OLANZapine zydis (ZYPREXA) disintegrating tablet 10 mg  10 mg Oral Q8H PRN Sindy Guadeloupe, NP  And   LORazepam (ATIVAN) tablet 1 mg  1 mg Oral PRN Sindy Guadeloupe, NP       And   ziprasidone (GEODON) injection 20 mg  20 mg Intramuscular PRN Sindy Guadeloupe, NP       magnesium hydroxide (MILK OF MAGNESIA) suspension 30 mL  30 mL Oral Daily PRN Sindy Guadeloupe, NP       metoprolol succinate (TOPROL-XL) 24 hr tablet 12.5 mg  12.5 mg Oral Daily Bennett, Christal H, NP   12.5 mg at 04/09/23 0919   mirtazapine (REMERON) tablet 15 mg  15 mg Oral QHS Sarina Ill, DO       traZODone (DESYREL) tablet 25 mg  25 mg Oral QHS PRN Bennett, Christal H, NP        Lab Results: No results found for this or any previous visit (from the past 48 hour(s)).  Blood Alcohol level:  Lab Results  Component Value Date   ETH <10 04/03/2023    Metabolic Disorder Labs: No results found for: "HGBA1C", "MPG" No results found for: "PROLACTIN" No results found for: "CHOL", "TRIG", "HDL", "CHOLHDL", "VLDL", "LDLCALC"  Physical Findings: AIMS:  , ,  ,  ,    CIWA:    COWS:     Musculoskeletal: Strength & Muscle Tone: within normal limits Gait & Station: normal Patient leans: N/A  Psychiatric Specialty Exam:  Presentation  General Appearance:  Casual  Eye Contact: Good  Speech: Clear and Coherent  Speech Volume: Normal  Handedness: Right   Mood and Affect  Mood: Angry  Affect: Blunt   Thought Process  Thought Processes: Linear  Descriptions of Associations:Intact  Orientation:Full (Time, Place and Person)  Thought Content:Obsessions  History of Schizophrenia/Schizoaffective disorder:No  Duration of Psychotic Symptoms:No  data recorded Hallucinations:No data recorded Ideas of Reference:None  Suicidal Thoughts:No data recorded Homicidal Thoughts:No data recorded  Sensorium  Memory: Immediate Good  Judgment: Fair  Insight: Lacking   Executive Functions  Concentration: Fair  Attention Span: Good  Recall: Good  Fund of Knowledge: Good  Language: Good   Psychomotor Activity  Psychomotor Activity:No data recorded  Assets  Assets: Desire for Improvement; Resilience   Sleep  Sleep:No data recorded    Blood pressure (!) 128/103, pulse 76, temperature 97.7 F (36.5 C), resp. rate 14, height 6\' 4"  (1.93 m), weight 99.1 kg, SpO2 100%. Body mass index is 26.6 kg/m.   Treatment Plan Summary: Daily contact with patient to assess and evaluate symptoms and progress in treatment, Medication management, and Plan increase Remeron to 15 mg at bedtime.  Verdine Grenfell [place], DO 04/09/2023, 11:13 AM

## 2023-04-09 NOTE — Progress Notes (Signed)
Patient pleasant and cooperative with assessment questions.  Patient reports he has no energy and feels numb.  "I'm just here."  Denies SI, HI and AVH.  Denies pain. Compliant with scheduled medications.  15 min checks in place for safety.  Patient reports he eats very little.  Did not eat breakfast.    Patient ate lunch and interacted with another patent appropriately.  Participated in RT group and has been interacting in the milieu with peers and staff.

## 2023-04-09 NOTE — Progress Notes (Signed)
   04/09/23 0604  15 Minute Checks  Location Bedroom  Visual Appearance Calm  Behavior Sleeping  Sleep (Behavioral Health Patients Only)  Calculate sleep? (Click Yes once per 24 hr at 0600 safety check) Yes  Documented sleep last 24 hours 11.5

## 2023-04-09 NOTE — Group Note (Signed)
Date:  04/09/2023 Time:  8:21 PM  Group Topic/Focus:  Building Self Esteem:   The Focus of this group is helping patients become aware of the effects of self-esteem on their lives, the things they and others do that enhance or undermine their self-esteem, seeing the relationship between their level of self-esteem and the choices they make and learning ways to enhance self-esteem.    Participation Level:  Active  Participation Quality:  Appropriate  Affect:  Appropriate  Cognitive:  Appropriate  Insight: Appropriate  Engagement in Group:  Engaged  Modes of Intervention:  Education  Additional Comments:    Garry Heater 04/09/2023, 8:21 PM

## 2023-04-09 NOTE — Group Note (Signed)
Recreation Therapy Group Note   Group Topic:Problem Solving  Group Date: 04/09/2023 Start Time: 1400 End Time: 1445 Facilitators: Rosina Lowenstein, LRT, CTRS Location:  Day Room  Group Description: Life Boat. Patients were given the scenario that they are on a boat that is about to become shipwrecked, leaving them stranded on an [place]. They are asked to make a list of 10 different items that they want to take with them when they are stranded on the [place]. Patients are asked to rank their items from most important to least important, #1 being the most important and #10 being the least. Patients will work individually for the first round to come up with 10 items and then as a group to condense their list and come up with one list of 10 items between all of them. Patients or LRT will read aloud the 10 different items to the group after each round. LRT facilitated post-activity processing to discuss how this activity can be used in daily life post discharge.   Goal Area(s) Addressed:  Patient will identify priorities, wants and needs. Patient will communicate with LRT and peers. Patient will work collectively as a Administrator, Civil Service. Patient will work on Product manager.    Affect/Mood: Appropriate   Participation Level: Active and Hyperverbal   Participation Quality: Independent   Behavior: Alert and Eager   Speech/Thought Process: Coherent   Insight: Good   Judgement: Fair    Modes of Intervention: Activity   Patient Response to Interventions:  Attentive, Engaged, Interested , and Receptive   Education Outcome:  Acknowledges education   Clinical Observations/Individualized Feedback: Sharyon Medicus was active in their participation of session activities and group discussion. Pt identified "friends, water, food, fire, other people" as items he wants to bring with him on [address]. Pt gets very side tracked and enjoys talking with others. Pt was pleasant when interacting with LRT and  peers.    Plan: Continue to engage patient in RT group sessions 2-3x/week.   Rosina Lowenstein, LRT, CTRS 04/09/2023 2:57 PM

## 2023-04-09 NOTE — Plan of Care (Signed)
  Problem: Education: Goal: Knowledge of General Education information will improve Description: Including pain rating scale, medication(s)/side effects and non-pharmacologic comfort measures Outcome: Progressing   Problem: Coping: Goal: Level of anxiety will decrease Outcome: Progressing   Problem: Safety: Goal: Ability to remain free from injury will improve Outcome: Progressing   Problem: Coping: Goal: Coping ability will improve Outcome: Progressing

## 2023-04-09 NOTE — Plan of Care (Signed)
   Problem: Pain Managment: Goal: General experience of comfort will improve Outcome: Progressing   Problem: Safety: Goal: Ability to remain free from injury will improve Outcome: Progressing   

## 2023-04-10 DIAGNOSIS — R45851 Suicidal ideations: Secondary | ICD-10-CM | POA: Diagnosis not present

## 2023-04-10 NOTE — Progress Notes (Signed)
   04/10/23 0000  Psych Admission Type (Psych Patients Only)  Admission Status Involuntary  Psychosocial Assessment  Patient Complaints Restlessness  Eye Contact Fair  Facial Expression Animated  Affect Labile  Speech Logical/coherent  Interaction Assertive  Motor Activity Pacing  Appearance/Hygiene Unremarkable  Behavior Characteristics Cooperative  Mood Labile  Thought Process  Coherency WDL  Content Blaming others  Delusions None reported or observed  Perception WDL  Hallucination None reported or observed  Judgment Impaired  Confusion None  Danger to Self  Current suicidal ideation? Denies  Danger to Others  Danger to Others None reported or observed

## 2023-04-10 NOTE — Progress Notes (Signed)
Patient is IVC'd to [facility] on 04/04/23 for S/I. Denies S/I, H/I, AVH. Calm, cooperative, interacting well with peers and staff. Ambulates without assistance and independent with ADL's. Likes to stay in his room but comes out for meals. Is up for discharge tomorrow to home with wife per notes. Will continue to monitor.

## 2023-04-10 NOTE — Group Note (Signed)
Date:  04/10/2023 Time:  8:30 PM  Group Topic/Focus:  Goals Group:   The focus of this group is to help patients establish daily goals to achieve during treatment and discuss how the patient can incorporate goal setting into their daily lives to aide in recovery. Self Care:   The focus of this group is to help patients understand the importance of self-care in order to improve or restore emotional, physical, spiritual, interpersonal, and financial health. Wrap-Up Group:   The focus of this group is to help patients review their daily goal of treatment and discuss progress on daily workbooks.    Participation Level:  Active  Participation Quality:  Appropriate  Affect:  Appropriate  Cognitive:  Appropriate  Insight: Appropriate  Engagement in Group:  Engaged  Modes of Intervention:  Activity  Additional Comments:    Randy Shaffer 04/10/2023, 8:30 PM

## 2023-04-10 NOTE — Progress Notes (Signed)
Pt denies constipation but states last bm 2 weeks ago. Denies pain, or discomfort. States normally goes 1x per week. States passing gas. Educated patient on constipation but still declines prn meds to help. States understanding to let nurse know if any pain occurs or changes mind on medication.

## 2023-04-10 NOTE — BHH Counselor (Signed)
CSW contacted pt's wife Randy Shaffer [phone] per MD's request for collateral.   Pt's wife states that she feels comfortable with the pt returning home and has no concerns for his safety if he was to return home.   CSW will inform MD of conversation.   Pt's wife encouraged to call if she has any questions or concerns.   [name], MSW, [place] 04/10/2023 9:15 AM

## 2023-04-10 NOTE — Group Note (Signed)
Date:  04/10/2023 Time:  10:54 AM  Group Topic/Focus:  Healthy Communication:   The focus of this group is to discuss communication, barriers to communication, as well as healthy ways to communicate with others.    Participation Level:  Did Not Attend  Participation Quality:    Affect:    Cognitive:    Insight:   Engagement in Group:    Modes of Intervention:    Additional Comments:  Did not attend  Dianey Suchy T Kerrie Timm 04/10/2023, 10:54 AM

## 2023-04-10 NOTE — BHH Counselor (Signed)
CSW spoke with pt about discharge planning. Pt declined follow-up with psychiatry, but asked for couples counseling.   CSW contacted [facility] to set up an appointment for couples counseling per pt's request.   [facility] informed CSW that they cannot make appts without paperwork being filled out by pt. CSW had [facility] send appointment paperwork to pt's email for him to fill out once he is discharged.   [facility] informed pt they have plenty of appointments available. CSW will inform pt of this.   [name], MSW, [place] 04/10/2023 3:16 PM

## 2023-04-10 NOTE — Progress Notes (Signed)
   04/10/23 0615  15 Minute Checks  Location Bedroom  Visual Appearance Calm  Behavior Sleeping  Sleep (Behavioral Health Patients Only)  Calculate sleep? (Click Yes once per 24 hr at 0600 safety check) Yes  Documented sleep last 24 hours 8.75

## 2023-04-10 NOTE — Progress Notes (Signed)
[facility] MD Progress Note  04/10/2023 11:11 AM Randy Shaffer  MRN:  [id number] Subjective:  Randy Shaffer is seen on rounds.  He says that he spoke with his wife and she is okay with him coming home.  I discussed with social work that due to his history he can leave tomorrow.  He has been pleasant and cooperative on the unit.  He says he is tired of lying in bed and needs to get out and be more physical.  He was started on Depakote for what seems to be mood problems and Remeron at nighttime for depression and sleep and he seems to be tolerating it well and has no side effects or complaints.  He definitely has a irritable side to him and his Depakote could probably be increased by his primary care doctor when he is discharged.  He denies being suicidal or homicidal. Principal Problem: Suicidal ideation Diagnosis: Principal Problem:   Suicidal ideation Active Problems:   Suicidal ideations  Total Time spent with patient: 15 minutes  Past Psychiatric History: Possible bipolar disorder  Past Medical History:  Past Medical History:  Diagnosis Date   Anginal pain (HCC)    Aortic atherosclerosis (HCC)    BPH (benign prostatic hyperplasia)    CAD (coronary artery disease) 12/13/2021   a.) R/LHC 12/13/2021: EF 55-65%, 25% dLM-oLAD, 25% pLAD, 50% m-dLAD; mPA 20, mPCWP 10, Ao sat 96, PA sat 71, CO 5.7.   Cardiac murmur    Carotid artery disease (HCC)    a.) CTA neck 03/02/2022: 70% LICA; b.) PTA 04/19/2022: 80% LICA --> POBA performed  followed by placement of a 9 mm prox, 7mm distal, 4 cm long exact stent placed; 10-15% residual stenosis; c.) carotid doppler 05/22/2022: 1-39% BICA   Complication of anesthesia    a.) PONV   Depression    Diet-controlled type 2 diabetes mellitus (HCC)    Diverticulosis 09/05/2011   DOE (dyspnea on exertion)    History of bilateral cataract extraction    HLD (hyperlipidemia)    Hypertension    Insomnia    a.) takes trazodone PRN   Long term current use of  antithrombotics/antiplatelets    a.) on daily DAPT (ASA + clopidogrel)   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    OA (osteoarthritis) of knee    PONV (postoperative nausea and vomiting)    SEVERE   Sleep apnea    a.) unable to tolerate nocturnal PAP therapy    Past Surgical History:  Procedure Laterality Date   AMPUTATION FINGER / THUMB Right    THUMB PLUS 3 RECONSTRUCTIVE SURGERIES   CAROTID PTA/STENT INTERVENTION Left 04/16/2022   Procedure: CAROTID PTA/STENT INTERVENTION;  Surgeon: Annice Needy, MD;  Location: [facility];  Service: Cardiovascular;  Laterality: Left;   COLONOSCOPY WITH PROPOFOL N/A 02/01/2023   Procedure: COLONOSCOPY WITH PROPOFOL;  Surgeon: Regis Bill, MD;  Location: [facility];  Service: Endoscopy;  Laterality: N/A;   CORONARY ANGIOPLASTY     HEAD LACERATION REPAIR     HERNIA REPAIR  09/24/1998   ONE AS INFANT   HIP NERVE RELEASE     HOLEP-LASER ENUCLEATION OF THE PROSTATE WITH MORCELLATION N/A 08/03/2022   Procedure: HOLEP-LASER ENUCLEATION OF THE PROSTATE WITH MORCELLATION;  Surgeon: Sondra Come, MD;  Location: [facility];  Service: Urology;  Laterality: N/A;   KNEE ARTHROSCOPY Right 09/24/2001   KNEE ARTHROSCOPY Right 02/23/2015   Procedure: ARTHROSCOPY KNEE WITH DEBRIDEMENT PARTIAL MEDIAL MENISECTOMY;  Surgeon: Christena Flake, MD;  Location: [facility];  Service: Orthopedics;  Laterality: Right;   PHOTOREFRACTIVE KERATOTOMY     RIGHT/LEFT HEART CATH AND CORONARY ANGIOGRAPHY N/A 12/13/2021   Procedure: RIGHT/LEFT HEART CATH AND CORONARY ANGIOGRAPHY;  Surgeon: Alwyn Pea, MD;  Location: [facility];  Service: Cardiovascular;  Laterality: N/A;   Family History:  Family History  Problem Relation Age of Onset   Heart disease Mother    Cancer Mother    Heart attack Father    Cancer Sister    Cancer Brother    Heart attack Maternal Uncle    Family Psychiatric  History: Unremarkable Social History:  Social History    Substance and Sexual Activity  Alcohol Use Yes   Comment: 2-3 BEER/MONTH     Social History   Substance and Sexual Activity  Drug Use No    Social History   Socioeconomic History   Marital status: Married    Spouse name: Randy Shaffer   Number of children: Not on file   Years of education: Not on file   Highest education level: Not on file  Occupational History   Not on file  Tobacco Use   Smoking status: Never    Passive exposure: Never   Smokeless tobacco: Never  Vaping Use   Vaping status: Never Used  Substance and Sexual Activity   Alcohol use: Yes    Comment: 2-3 BEER/MONTH   Drug use: No   Sexual activity: Yes    Birth control/protection: None  Other Topics Concern   Not on file  Social History Narrative   Not on file   Social Determinants of Health   Financial Resource Strain: Patient Declined (12/24/2022)   Received [facility], [facility]   Overall Financial Resource Strain (CARDIA)    Difficulty of Paying Living Expenses: Patient declined  Food Insecurity: No Food Insecurity (04/05/2023)   Hunger Vital Sign    Worried About Running Out of Food in the Last Year: Never true    Ran Out of Food in the Last Year: Never true  Transportation Needs: No Transportation Needs (04/05/2023)   PRAPARE - Administrator, Civil Service (Medical): No    Lack of Transportation (Non-Medical): No  Physical Activity: Not on file  Stress: Not on file  Social Connections: Not on file   Additional Social History:                         Sleep: Fair  Appetite:  Good  Current Medications: Current Facility-Administered Medications  Medication Dose Route Frequency Provider Last Rate Last Admin   acetaminophen (TYLENOL) tablet 650 mg  650 mg Oral Q6H PRN Sindy Guadeloupe, NP       alum & mag hydroxide-simeth (MAALOX/MYLANTA) 200-200-20 MG/5ML suspension 30 mL  30 mL Oral Q4H PRN Bennett, Christal H, NP       aspirin EC  tablet 81 mg  81 mg Oral Daily Bennett, Christal H, NP   81 mg at 04/10/23 0958   atorvastatin (LIPITOR) tablet 20 mg  20 mg Oral QHS Bennett, Christal H, NP   20 mg at 04/09/23 2100   clopidogrel (PLAVIX) tablet 75 mg  75 mg Oral Daily Bennett, Christal H, NP   75 mg at 04/10/23 0956   divalproex (DEPAKOTE) DR tablet 250 mg  250 mg Oral BID Lewanda Rife, MD   250 mg at 04/10/23 0958   hydrOXYzine (ATARAX) tablet 50 mg  50 mg Oral TID PRN Bennett, Christal H, NP       OLANZapine zydis (ZYPREXA) disintegrating tablet 10 mg  10 mg Oral Q8H PRN Sindy Guadeloupe, NP       And   LORazepam (ATIVAN) tablet 1 mg  1 mg Oral PRN Sindy Guadeloupe, NP       And   ziprasidone (GEODON) injection 20 mg  20 mg Intramuscular PRN Sindy Guadeloupe, NP       magnesium hydroxide (MILK OF MAGNESIA) suspension 30 mL  30 mL Oral Daily PRN Sindy Guadeloupe, NP       metoprolol succinate (TOPROL-XL) 24 hr tablet 12.5 mg  12.5 mg Oral Daily Bennett, Christal H, NP   12.5 mg at 04/10/23 0957   mirtazapine (REMERON) tablet 15 mg  15 mg Oral QHS Sarina Ill, DO   15 mg at 04/09/23 2100   traZODone (DESYREL) tablet 25 mg  25 mg Oral QHS PRN Bennett, Christal H, NP        Lab Results: No results found for this or any previous visit (from the past 48 hour(s)).  Blood Alcohol level:  Lab Results  Component Value Date   ETH <10 04/03/2023    Metabolic Disorder Labs: No results found for: "HGBA1C", "MPG" No results found for: "PROLACTIN" No results found for: "CHOL", "TRIG", "HDL", "CHOLHDL", "VLDL", "LDLCALC"  Physical Findings: AIMS:  , ,  ,  ,    CIWA:    COWS:     Musculoskeletal: Strength & Muscle Tone: within normal limits Gait & Station: normal Patient leans: N/A  Psychiatric Specialty Exam:  Presentation  General Appearance:  Casual  Eye Contact: Good  Speech: Clear and Coherent  Speech Volume: Normal  Handedness: Right   Mood and Affect   Mood: Angry  Affect: Blunt   Thought Process  Thought Processes: Linear  Descriptions of Associations:Intact  Orientation:Full (Time, Place and Person)  Thought Content:Obsessions  History of Schizophrenia/Schizoaffective disorder:No  Duration of Psychotic Symptoms:No data recorded Hallucinations:No data recorded Ideas of Reference:None  Suicidal Thoughts:No data recorded Homicidal Thoughts:No data recorded  Sensorium  Memory: Immediate Good  Judgment: Fair  Insight: Lacking   Executive Functions  Concentration: Fair  Attention Span: Good  Recall: Good  Fund of Knowledge: Good  Language: Good   Psychomotor Activity  Psychomotor Activity:No data recorded  Assets  Assets: Desire for Improvement; Resilience   Sleep  Sleep:No data recorded    Blood pressure 121/73, pulse (!) 52, temperature 98.4 F (36.9 C), resp. rate 15, height 6\' 4"  (1.93 m), weight 99.1 kg, SpO2 100%. Body mass index is 26.6 kg/m.   Treatment Plan Summary: Daily contact with patient to assess and evaluate symptoms and progress in treatment, Medication management, and Plan continue Depakote and Remeron and discharge tomorrow.  Dashia Caldeira Tresea Mall, DO 04/10/2023, 11:11 AM

## 2023-04-10 NOTE — Group Note (Signed)
Recreation Therapy Group Note   Group Topic:Relaxation  Group Date: 04/10/2023 Start Time: 1400 End Time: 1440 Facilitators: Rosina Lowenstein, LRT, CTRS Location:  Dayroom   Group Description: Meditation. LRT and patients discussed what they know about meditation and mindfulness. LRT played a Deep Breathing Meditation exercise script for patients to follow along to. LRT and patients discussed how meditation and deep breathing can be used as a coping skill post--discharge.    Goal Area(s) Addressed:  Patient will practice using relaxation technique. Patient will identify a new coping skill.  Patient will follow multistep directions to reduce anxiety and stress.   Affect/Mood: Appropriate   Participation Level: Active and Engaged   Participation Quality: Independent   Behavior: Appropriate, Calm, and Cooperative   Speech/Thought Process: Coherent   Insight: Good   Judgement: Fair    Modes of Intervention: Activity   Patient Response to Interventions:  Receptive   Education Outcome:  Acknowledges education   Clinical Observations/Individualized Feedback: Keelan was active in their participation of session activities and group discussion. Pt identified "I like to sit outside and listen to nature. I grew up in [place] so I would listen to the ocean multiple times a day, it was relaxing."  Pt interacted well with LRT and peers duration of session.  Plan: Continue to engage patient in RT group sessions 2-3x/week.   Rosina Lowenstein, LRT, CTRS 04/10/2023 3:36 PM

## 2023-04-11 MED ORDER — DIVALPROEX SODIUM 250 MG PO DR TAB: 250.0000 mg | DELAYED_RELEASE_TABLET | Freq: Two times a day (BID) | ORAL | 3 refills | Status: DC

## 2023-04-11 MED ORDER — MIRTAZAPINE 15 MG PO TABS: 15.0000 mg | ORAL_TABLET | Freq: Every day | ORAL | 3 refills | Status: DC

## 2023-04-11 NOTE — Progress Notes (Signed)
Pt is pleasant and cooperative, Pt denies any s/I, H/I or Avh. Pt is future oriented and affect is bright Pt is looking forward to discharge and returning home

## 2023-04-11 NOTE — Plan of Care (Signed)
  Problem: Nutrition: Goal: Adequate nutrition will be maintained Outcome: Progressing   Problem: Coping: Goal: Level of anxiety will decrease Outcome: Progressing   Problem: Coping: Goal: Coping ability will improve Outcome: Progressing  Patient compliant with medications interacting well with Peers and Staff. Patient stated the does not need to be in the hospital there is nothing wrong with him. Support and encouragement provided.

## 2023-04-11 NOTE — BHH Suicide Risk Assessment (Signed)
[facility] Discharge Suicide Risk Assessment   Principal Problem: Suicidal ideation Discharge Diagnoses: Principal Problem:   Suicidal ideation Active Problems:   Suicidal ideations   Total Time spent with patient: 1 hour  Musculoskeletal: Strength & Muscle Tone: within normal limits Gait & Station: normal Patient leans: N/A  Psychiatric Specialty Exam  Presentation  General Appearance:  Casual  Eye Contact: Good  Speech: Clear and Coherent  Speech Volume: Normal  Handedness: Right   Mood and Affect  Mood: Angry  Duration of Depression Symptoms: Greater than two weeks  Affect: Blunt   Thought Process  Thought Processes: Linear  Descriptions of Associations:Intact  Orientation:Full (Time, Place and Person)  Thought Content:Obsessions  History of Schizophrenia/Schizoaffective disorder:No  Duration of Psychotic Symptoms:No data recorded Hallucinations:No data recorded Ideas of Reference:None  Suicidal Thoughts:No data recorded Homicidal Thoughts:No data recorded  Sensorium  Memory: Immediate Good  Judgment: Fair  Insight: Lacking   Executive Functions  Concentration: Fair  Attention Span: Good  Recall: Good  Fund of Knowledge: Good  Language: Good   Psychomotor Activity  Psychomotor Activity:No data recorded  Assets  Assets: Desire for Improvement; Resilience   Sleep  Sleep:No data recorded   Blood pressure 133/71, pulse (!) 52, temperature (!) 97.5 F (36.4 C), resp. rate 18, height 6\' 4"  (1.93 m), weight 99.1 kg, SpO2 (!) 71%. Body mass index is 26.6 kg/m.  Mental Status Per Nursing Assessment::   On Admission:  NA (currently denies)  Demographic Factors:  Male and Age 65 or older  Loss Factors: NA  Historical Factors: NA  Risk Reduction Factors:   Positive social support, Positive therapeutic relationship, and Positive coping skills or problem solving skills   Cognitive Features That Contribute To  Risk:  None    Suicide Risk:  Minimal: No identifiable suicidal ideation.  Patients presenting with no risk factors but with morbid ruminations; may be classified as minimal risk based on the severity of the depressive symptoms   Follow-up Information     [facility] Schedule an appointment as soon as possible for a visit.   Why: This oupatient couples therapy provider will make your appointment once the paperwork they emailed you is completed. They are in network with your insurance, just be sure to bring your insurance card or have it prepared when filling out the paperwork. They stated they have plenty of availability for couples counseling. Contact information: [address] [place] [address] [phone]                 Plan Of Care/Follow-up recommendations: As above.   Sarina Ill, DO 04/11/2023, 10:23 AM

## 2023-04-11 NOTE — Care Management Important Message (Signed)
Important Message  Patient Details  Name: Randy Shaffer MRN: [id number] Date of Birth: July 22, 1951   Medicare Important Message Given:  Yes     Elza Rafter, LCSWA 04/11/2023, 12:04 PM

## 2023-04-11 NOTE — Discharge Summary (Signed)
Physician Discharge Summary Note  Patient:  Randy Shaffer is an 72 y.o., male MRN:  [id number] DOB:  1951-08-05 Patient phone:  [phone] (home)  Patient address:   [address] [place] [address],  Total Time spent with patient: 1 hour  Date of Admission:  04/05/2023 Date of Discharge: 04/11/2023  Reason for Admission:  Patient is currently on the IVC because of suicide ideation wanted to kill himself per police report. According to patient he and his wife are both retired he has been retired for the past 3 years and he just want to be left alone according to the patient he normally would go in the woods for days because he likes to be left alone. Per the patient he is planning to kill the power to the house, shot the house down, and  go to the mountains for 5 months and be left alone  Today  when approached, the patient was walking around his room and mentioned" I need to walk 200 miles".  During assessment  patient was labile, and tearful at times.  Patient reports that" I need a purpose in life".  Patient reports sometimes he wants to stay focused and finish work but his wife is in "nagging".  Patient said that he was frustrated and he made a comment that he wants to go woods or mountains  to get away from his wife.  Later patient started crying and said" my wife is going to leave me, I do not think she is going to let me come back home".  Patient was provided with support and reassurance.  Patient reports that he has 250 acres of woods in his backyard.  Patient also reported that "I do not like myself anymore, I need a purpose".  He denies any intention or plan to harm himself on the unit.  Patient was screened for bipolar disorder.  Patient said" I do better when I do not sleep".  Patient reports he has not slept in a week or so.  He also reports depressed mood, irritability, and variable energy level.  Patient reports that sometimes he feels happy and his mind races, he is more  irritable, and feels no need to sleep.  Patient does have  bipolar traits.  We discussed bipolar disorder and also psychotropic medications.  Patient is willing to try Depakote for mood stabilization and poor impulse control.  He agrees to try Remeron for depression.    Principal Problem: Suicidal ideation Discharge Diagnoses: Principal Problem:   Suicidal ideation Active Problems:   Suicidal ideations   Past Psychiatric History: Unremarkable  Past Medical History:  Past Medical History:  Diagnosis Date   Anginal pain (HCC)    Aortic atherosclerosis (HCC)    BPH (benign prostatic hyperplasia)    CAD (coronary artery disease) 12/13/2021   a.) R/LHC 12/13/2021: EF 55-65%, 25% dLM-oLAD, 25% pLAD, 50% m-dLAD; mPA 20, mPCWP 10, Ao sat 96, PA sat 71, CO 5.7.   Cardiac murmur    Carotid artery disease (HCC)    a.) CTA neck 03/02/2022: 70% LICA; b.) PTA 04/19/2022: 80% LICA --> POBA performed  followed by placement of a 9 mm prox, 7mm distal, 4 cm long exact stent placed; 10-15% residual stenosis; c.) carotid doppler 05/22/2022: 1-39% BICA   Complication of anesthesia    a.) PONV   Depression    Diet-controlled type 2 diabetes mellitus (HCC)    Diverticulosis 09/05/2011   DOE (dyspnea on exertion)    History  of bilateral cataract extraction    HLD (hyperlipidemia)    Hypertension    Insomnia    a.) takes trazodone PRN   Long term current use of antithrombotics/antiplatelets    a.) on daily DAPT (ASA + clopidogrel)   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    OA (osteoarthritis) of knee    PONV (postoperative nausea and vomiting)    SEVERE   Sleep apnea    a.) unable to tolerate nocturnal PAP therapy    Past Surgical History:  Procedure Laterality Date   AMPUTATION FINGER / THUMB Right    THUMB PLUS 3 RECONSTRUCTIVE SURGERIES   CAROTID PTA/STENT INTERVENTION Left 04/16/2022   Procedure: CAROTID PTA/STENT INTERVENTION;  Surgeon: Annice Needy, MD;  Location: [facility];  Service:  Cardiovascular;  Laterality: Left;   COLONOSCOPY WITH PROPOFOL N/A 02/01/2023   Procedure: COLONOSCOPY WITH PROPOFOL;  Surgeon: Regis Bill, MD;  Location: [facility];  Service: Endoscopy;  Laterality: N/A;   CORONARY ANGIOPLASTY     HEAD LACERATION REPAIR     HERNIA REPAIR  09/24/1998   ONE AS INFANT   HIP NERVE RELEASE     HOLEP-LASER ENUCLEATION OF THE PROSTATE WITH MORCELLATION N/A 08/03/2022   Procedure: HOLEP-LASER ENUCLEATION OF THE PROSTATE WITH MORCELLATION;  Surgeon: Sondra Come, MD;  Location: [facility] OR[facility];  Service: Urology;  Laterality: N/A;   KNEE ARTHROSCOPY Right 09/24/2001   KNEE ARTHROSCOPY Right 02/23/2015   Procedure: ARTHROSCOPY KNEE WITH DEBRIDEMENT PARTIAL MEDIAL MENISECTOMY;  Surgeon: Christena Flake, MD;  Location[facility];  Service: Orthopedics;  Laterality: Right;   PHOTOREFRACTIVE KERATOTOMY     RIGHT/LEFT HEART CATH AND CORONARY ANGIOGRAPHY N/A 12/13/2021   Procedure: RIGHT/LEFT HEART CATH AND CORONARY ANGIOGRAPHY;  Surgeon: Alwyn Pea, MD;  Location: [facility];  Service: Cardiovascular;  Laterality: N/A;   Family History:  Family History  Problem Relation Age of Onset   Heart disease Mother    Cancer Mother    Heart attack Father    Cancer Sister    Cancer Brother    Heart attack Maternal Uncle    Family Psychiatric  History: Unremarkable Social History:  Social History   Substance and Sexual Activity  Alcohol Use Yes   Comment: 2-3 BEER/MONTH     Social History   Substance and Sexual Activity  Drug Use No    Social History   Socioeconomic History   Marital status: Married    Spouse name: Olegario Messier   Number of children: Not on file   Years of education: Not on file   Highest education level: Not on file  Occupational History   Not on file  Tobacco Use   Smoking status: Never    Passive exposure: Never   Smokeless tobacco: Never  Vaping Use   Vaping status: Never Used  Substance and Sexual  Activity   Alcohol use: Yes    Comment: 2-3 BEER/MONTH   Drug use: No   Sexual activity: Yes    Birth control/protection: None  Other Topics Concern   Not on file  Social History Narrative   Not on file   Social Determinants of Health   Financial Resource Strain: Patient Declined (12/24/2022)   Received from [facility]   Overall Financial Resource Strain (CARDIA)    Difficulty of Paying Living Expenses: Patient declined  Food Insecurity: No Food Insecurity (04/05/2023)   Hunger Vital Sign    Worried About Running  Out of Food in the Last Year: Never true    Ran Out of Food in the Last Year: Never true  Transportation Needs: No Transportation Needs (04/05/2023)   PRAPARE - Administrator, Civil Service (Medical): No    Lack of Transportation (Non-Medical): No  Physical Activity: Not on file  Stress: Not on file  Social Connections: Not on file    Hospital Course: Jayvion is a 72 year old white male who was involuntarily admitted to geriatric psychiatry for concerns over suicidal ideation.  His wife took out commitment papers because he ran into the woods and she was afraid that he was going to hurt himself after an argument.  He says that she did it because her father committed suicide.  He did have an irritable affect to him and he was started on Depakote and Remeron which was titrated up to 15 mg at bedtime and the Depakote stayed at 250 mg twice a day.  He tolerated the medications without any problems.  He said that he probably needed something for mood stabilization because he does have mood swings.  He is treated by his primary care physician mostly.  He has been on Prozac and Paxil in the past.  He denies being depressed.  He says that he spoke to his wife numerous times and there was no concern on her him coming home.  He said that he would stay on the medication and that he thought that it might be helpful.  Told him that  he might need to have the Depakote titrated up but that he was doing fine on the unit.  He was pleasant and cooperative.  No side effects.  It was felt that he maximize hospitalization he was discharged home.  On the day of discharge he denied suicidal ideation, homicidal ideation, auditory or visual hallucinations.  His judgment and insight were good.  Physical Findings: AIMS:  , ,  ,  ,    CIWA:    COWS:     Musculoskeletal: Strength & Muscle Tone: within normal limits Gait & Station: normal Patient leans: N/A   Psychiatric Specialty Exam:  Presentation  General Appearance:  Casual  Eye Contact: Good  Speech: Clear and Coherent  Speech Volume: Normal  Handedness: Right   Mood and Affect  Mood: Angry  Affect: Blunt   Thought Process  Thought Processes: Linear  Descriptions of Associations:Intact  Orientation:Full (Time, Place and Person)  Thought Content:Obsessions  History of Schizophrenia/Schizoaffective disorder:No  Duration of Psychotic Symptoms:No data recorded Hallucinations:No data recorded Ideas of Reference:None  Suicidal Thoughts:No data recorded Homicidal Thoughts:No data recorded  Sensorium  Memory: Immediate Good  Judgment: Fair  Insight: Lacking   Executive Functions  Concentration: Fair  Attention Span: Good  Recall: Good  Fund of Knowledge: Good  Language: Good   Psychomotor Activity  Psychomotor Activity:No data recorded  Assets  Assets: Desire for Improvement; Resilience   Sleep  Sleep:No data recorded   Physical Exam: Physical Exam Vitals and nursing note reviewed.  Constitutional:      Appearance: Normal appearance. He is normal weight.  Neurological:     General: No focal deficit present.     Mental Status: He is alert and oriented to person, place, and time.  Psychiatric:        Attention and Perception: Attention and perception normal.        Mood and Affect: Mood and affect normal.         Speech: Speech normal.  Behavior: Behavior normal. Behavior is cooperative.        Thought Content: Thought content normal.        Cognition and Memory: Cognition and memory normal.        Judgment: Judgment normal.    Review of Systems  Constitutional: Negative.   HENT: Negative.    Eyes: Negative.   Respiratory: Negative.    Cardiovascular: Negative.   Gastrointestinal: Negative.   Genitourinary: Negative.   Musculoskeletal: Negative.   Skin: Negative.   Neurological: Negative.   Endo/Heme/Allergies: Negative.   Psychiatric/Behavioral: Negative.     Blood pressure 133/71, pulse (!) 52, temperature (!) 97.5 F (36.4 C), resp. rate 18, height 6\' 4"  (1.93 m), weight 99.1 kg, SpO2 (!) 71%. Body mass index is 26.6 kg/m.   Social History   Tobacco Use  Smoking Status Never   Passive exposure: Never  Smokeless Tobacco Never   Tobacco Cessation:  N/A, patient does not currently use tobacco products   Blood Alcohol level:  Lab Results  Component Value Date   ETH <10 04/03/2023    Metabolic Disorder Labs:  No results found for: "HGBA1C", "MPG" No results found for: "PROLACTIN" No results found for: "CHOL", "TRIG", "HDL", "CHOLHDL", "VLDL", "LDLCALC"  See Psychiatric Specialty Exam and Suicide Risk Assessment completed by Attending Physician prior to discharge.  Discharge destination:  Home  Is patient on multiple antipsychotic therapies at discharge:  No   Has Patient had three or more failed trials of antipsychotic monotherapy by history:  No  Recommended Plan for Multiple Antipsychotic Therapies: NA   Allergies as of 04/11/2023       Reactions   Thiopental Sodium [thiopental] Nausea And Vomiting   SEVERE VOMITING        Medication List     STOP taking these medications    FLUoxetine 20 MG capsule Commonly known as: PROZAC   PARoxetine 20 MG tablet Commonly known as: PAXIL       TAKE these medications      Indication  albuterol  108 (90 Base) MCG/ACT inhaler Commonly known as: VENTOLIN HFA Inhale into the lungs.    aspirin EC 81 MG tablet Take 81 mg by mouth daily. Swallow whole.    atorvastatin 40 MG tablet Commonly known as: LIPITOR Take 20 mg by mouth in the morning.    Breztri Aerosphere 160-9-4.8 MCG/ACT Aero Generic drug: Budeson-Glycopyrrol-Formoterol Inhale into the lungs. Inhale 2 inhalations into the lungs 2 (two) times daily    clopidogrel 75 MG tablet Commonly known as: PLAVIX TAKE 1 TABLET BY MOUTH EVERY DAY    divalproex 250 MG DR tablet Commonly known as: DEPAKOTE Take 1 tablet (250 mg total) by mouth 2 (two) times daily.  Indication: MIXED BIPOLAR AFFECTIVE DISORDER   isosorbide mononitrate 30 MG 24 hr tablet Commonly known as: IMDUR Take 30 mg by mouth daily.    metoprolol succinate 25 MG 24 hr tablet Commonly known as: TOPROL-XL Take 12.5 mg by mouth in the morning and at bedtime.    mirtazapine 15 MG tablet Commonly known as: REMERON Take 1 tablet (15 mg total) by mouth at bedtime.  Indication: Major Depressive Disorder   tamsulosin 0.4 MG Caps capsule Commonly known as: FLOMAX Take 0.4 mg by mouth daily.    traZODone 50 MG tablet Commonly known as: DESYREL TAKE 1.5-2 TABS BY MOUTH AT NIGHT FOR SLEEP         Follow-up Information     [facility] Schedule an  appointment as soon as possible for a visit.   Why: This oupatient couples therapy provider will make your appointment once the paperwork they emailed you is completed. They are in network with your insurance, just be sure to bring your insurance card or have it prepared when filling out the paperwork. They stated they have plenty of availability for couples counseling. Contact information: [address] [place] [address] [phone]                 Follow-up recommendations:  [facility]    Signed: Sarina Ill, DO 04/11/2023, 10:34 AM

## 2023-04-11 NOTE — Plan of Care (Signed)
D- Patient alert and oriented. Affect bright,smiles upon approach. Denies SI, HI, AVH, and pain. A- Scheduled medications administered to patient, per MD orders. Support and encouragement provided.  Routine safety checks conducted every 15 minutes.  Patient informed to notify staff with problems or concerns. R- No adverse drug reactions noted. Patient contracts for safety at this time. Patient compliant with medications and treatment plan. Patient receptive, calm, and cooperative. Patient interacts well with others on the unit.  Patient remains safe at this time.

## 2023-06-04 ENCOUNTER — Ambulatory Visit (INDEPENDENT_AMBULATORY_CARE_PROVIDER_SITE_OTHER): Payer: Medicare Other

## 2023-06-04 ENCOUNTER — Ambulatory Visit (INDEPENDENT_AMBULATORY_CARE_PROVIDER_SITE_OTHER): Payer: Medicare Other | Admitting: Vascular Surgery

## 2023-06-04 ENCOUNTER — Encounter (INDEPENDENT_AMBULATORY_CARE_PROVIDER_SITE_OTHER): Payer: Self-pay

## 2023-06-04 DIAGNOSIS — I6522 Occlusion and stenosis of left carotid artery: Secondary | ICD-10-CM | POA: Diagnosis not present

## 2023-06-25 ENCOUNTER — Telehealth: Payer: Self-pay

## 2023-06-25 NOTE — Telephone Encounter (Signed)
Pt states he called the office and when he did not get  a response he  decided to walk in.  Pt LM on triage line at [phone] and [phone]  Informed pt that we have 4 hours to respond to messages. He also stated that he has a appt today with pcp at [facility].   Pt states that for the last 3-4 days he has had urgency, weak stream, and frequency. Urine is  dark red  with blood clots. He stopped his Plavix 2 days ago and the urine is not has dark. Unsure of fevers. NO burning with urination. Pain after urination.   He is s/p holep- 11/23 for BPH with obstruction.  LV was 4/24 with gross hematuria. Neg u/a and ucx.  Advised cystoscopy which pt did not have.   Advised pt to have pcp collect u/a and culture today. Appt made with [facility] 10/2 845 in [place].  Pt voiced understanding.

## 2023-06-25 NOTE — Telephone Encounter (Signed)
Pt left message on triage line at 8:04. Pt states he has been passing blood clots since Sunday morning. Pt states he wanted to be seen ASAP or if he should just go ahead and go to the ER. LVM for pt to return call, to get some more information on symptoms.

## 2023-06-26 ENCOUNTER — Other Ambulatory Visit: Payer: Self-pay

## 2023-06-26 ENCOUNTER — Ambulatory Visit: Payer: Medicare Other | Admitting: Urology

## 2023-06-26 ENCOUNTER — Encounter: Payer: Self-pay | Admitting: Urology

## 2023-06-26 VITALS — BP 149/80 | HR 59 | Ht 76.0 in | Wt 245.0 lb

## 2023-06-26 DIAGNOSIS — N401 Enlarged prostate with lower urinary tract symptoms: Secondary | ICD-10-CM | POA: Diagnosis not present

## 2023-06-26 DIAGNOSIS — N138 Other obstructive and reflux uropathy: Secondary | ICD-10-CM

## 2023-06-26 DIAGNOSIS — R310 Gross hematuria: Secondary | ICD-10-CM

## 2023-06-26 LAB — BLADDER SCAN AMB NON-IMAGING

## 2023-06-26 MED ORDER — SULFAMETHOXAZOLE-TRIMETHOPRIM 800-160 MG PO TABS: 1.0000 | ORAL_TABLET | Freq: Two times a day (BID) | ORAL | 0 refills | Status: DC

## 2023-06-26 NOTE — Progress Notes (Signed)
06/26/2023 8:36 AM   Randy Shaffer 16-Feb-1951 [id number]  Reason for visit: Hematuria, possible UTI, history of HOLEP  HPI: 72 year old male with BPH and obstructive symptoms refractory to medical management, PVRs greater than preop, who opted for HOLEP.  He underwent uncomplicated HOLEP on 08/03/2022 with removal of 35 g of benign prostate tissue. Noted to have a large wide mouthed posterior bladder diverticulum at time of surgery.  I saw him in April 2024 when he was doing well and urinating with a strong stream, having nocturia 0-1 time overnight, and denied any significant incontinence.  PVR at that time was normal at 99ml.  He did have a history of an elevated PVR after HOLEP in February 2024 of , and the bladder scan at that time appreciated a bilobed bladder consistent with a large posterior bladder diverticulum.  He has been leaning forward to void which helps.  When I saw him in April 2024 he had reported some dark urine with possible blood, urinalysis was benign and culture negative, and I recommended cystoscopy.  He never followed up.  He was admitted to inpatient psychiatry unit in July 2024 for suicidal ideation, felt to be related to undiagnosed bipolar disorder, and started on Depakote at that time.  He reports 3 days of gross hematuria with blood clots, weak urinary stream, frequency, and some intermittent dysuria.  He is on Plavix for history of vascular disease.  Gross hematuria has improved after holding Plavix, urine today is faint pink.  PVR today , again with posterior diverticulum appreciated on bladder scan.  Urinalysis yesterday with PCP appeared grossly infected with no squamous cells, greater than 50 WBC, greater than 50 RBC, large leukocytes, nitrite negative, moderate bacteria, greater than 300 protein.  We discussed possible etiologies including UTI, prostatitis, pyelonephritis, nephrolithiasis, bladder neck contracture, malignancy.  I again  recommended cystoscopy with his history of questionable hematuria as well as significant hematuria with possible infection.  Bactrim DS twice daily x 2 weeks, will follow-up outside urine culture RTC for cystoscopy 3 to 4 weeks, anticipate CT hematuria as well   Sondra Come, MD  [facility] [address] [place], [place] [address] [phone]

## 2023-07-02 ENCOUNTER — Telehealth: Payer: Self-pay

## 2023-07-02 DIAGNOSIS — R310 Gross hematuria: Secondary | ICD-10-CM

## 2023-07-02 NOTE — Telephone Encounter (Signed)
CT scan ordered. Mychart message sent.

## 2023-07-02 NOTE — Telephone Encounter (Signed)
-----   Message from Sondra Come sent at 07/02/2023 11:23 AM EDT ----- Regarding: CT His urine culture was ultimately negative, please order CT urogram to be completed prior to upcoming cystoscopy that is already scheduled, thank you.  This is for further evaluation of the blood in his urine  Legrand Rams, MD 07/02/2023 ----- Message ----- From: Sondra Come, MD Sent: 07/01/2023   8:00 AM EDT To: Sondra Come, MD Subject: follow up outside urine culture

## 2023-07-12 ENCOUNTER — Other Ambulatory Visit (INDEPENDENT_AMBULATORY_CARE_PROVIDER_SITE_OTHER): Payer: Self-pay | Admitting: Vascular Surgery

## 2023-07-16 ENCOUNTER — Telehealth: Payer: Self-pay

## 2023-07-16 NOTE — Telephone Encounter (Signed)
Pt calls triage line to inquire about a call he receiving requesting he cancel his cystoscopy appt for tomorrow due to CT scan not being complete. Patient works out of town and drove back to [facility] specifically for this appointment. Advised pt that I do not see documentation of this phone call however he can keep appt tomorrow for cysto and Dr. Richardo Hanks will contact him with CT results when they become available after October 28th. Pt voiced understanding.

## 2023-07-17 ENCOUNTER — Ambulatory Visit: Payer: Medicare Other | Admitting: Urology

## 2023-07-17 ENCOUNTER — Encounter: Payer: Self-pay | Admitting: Urology

## 2023-07-17 VITALS — BP 146/54 | HR 67 | Ht 76.0 in | Wt 245.0 lb

## 2023-07-17 DIAGNOSIS — R319 Hematuria, unspecified: Secondary | ICD-10-CM | POA: Diagnosis not present

## 2023-07-17 DIAGNOSIS — Z87440 Personal history of urinary (tract) infections: Secondary | ICD-10-CM

## 2023-07-17 MED ORDER — SULFAMETHOXAZOLE-TRIMETHOPRIM 800-160 MG PO TABS
1.0000 | ORAL_TABLET | Freq: Once | ORAL | Status: AC
Start: 2023-07-17 — End: 2023-07-17
  Administered 2023-07-17: 1 via ORAL

## 2023-07-17 NOTE — Progress Notes (Signed)
Cystoscopy Procedure Note:  Indication: Hematuria/possible UTI  72 year old male with a large posterior wall bladder diverticulum, elevated PVR and obstructive symptoms refractory to medical management and underwent HOLEP in November 2023.  Overall has been doing well, but had an episode of dysuria and gross hematuria, urinalysis appeared grossly infected but culture was ultimately negative.  Symptoms resolved with 2-week course of Bactrim, he denies any urinary complaints today.  After informed consent and discussion of the procedure and its risks, Randy Shaffer was positioned and prepped in the standard fashion. Cystoscopy was performed with a flexible cystoscope. The urethra, bladder neck and entire bladder was visualized in a standard fashion.  Small wispy narrowing at the bulbar urethra that was easily bypassed with the scope.  The prostatic fossa was wide open. The ureteral orifices were visualized in their normal location and orientation.  Small mouthed large posterior wall bladder diverticulum, no abnormalities within.  No abnormalities on retroflexion.  Imaging: CT scheduled for 10/28, will call with results  Findings: Normal cystoscopy, large posterior bladder wall diverticulum with small opening  Assessment and Plan: Will follow-up CT scan, encouraged to lean forward when voiding to empty posterior bladder wall diverticulum If recurrent infections, could consider diverticulectomy in the future  He had questions about ED, he has had ED for at least 2 years, he takes daily Imdur and cannot use PDE 5 inhibitors.  Risk and benefits were discussed.  Legrand Rams, MD 07/17/2023

## 2023-07-22 ENCOUNTER — Ambulatory Visit
Admission: RE | Admit: 2023-07-22 | Discharge: 2023-07-22 | Disposition: A | Discharge status: Home / Self Care | Payer: Medicare Other | Source: Ambulatory Visit | Attending: Urology | Admitting: Urology

## 2023-07-22 DIAGNOSIS — R310 Gross hematuria: Secondary | ICD-10-CM | POA: Insufficient documentation

## 2023-07-22 LAB — POCT I-STAT CREATININE: Creatinine, Ser: 1.2 mg/dL (ref 0.61–1.24)

## 2023-07-22 MED ORDER — IOHEXOL 300 MG/ML  SOLN
100.0000 mL | Freq: Once | INTRAMUSCULAR | Status: AC | PRN
  Administered 2023-07-22: 100 mL via INTRAVENOUS

## 2023-07-31 ENCOUNTER — Ambulatory Visit: Payer: Medicare Other | Admitting: Urology

## 2023-08-01 ENCOUNTER — Ambulatory Visit: Payer: Medicare Other | Admitting: Urology

## 2023-09-25 IMAGING — CT CT ANGIO NECK
3 of 7 series · 8 of 33 positions shown · non-contrast
Comparison: Carotid Doppler ultrasound 01/10/2022

CLINICAL DATA: 70-year-old male with increasing dizziness, syncope.
Evidence of left carotid stenosis by ultrasound. It



[Series 5: cta neck cta neck (person_name) 2.00 · axial · 0.47mm/px · z∈[-668,-570]mm · 2 of 148 slices shown]
[im 50/148  soft-tissue]
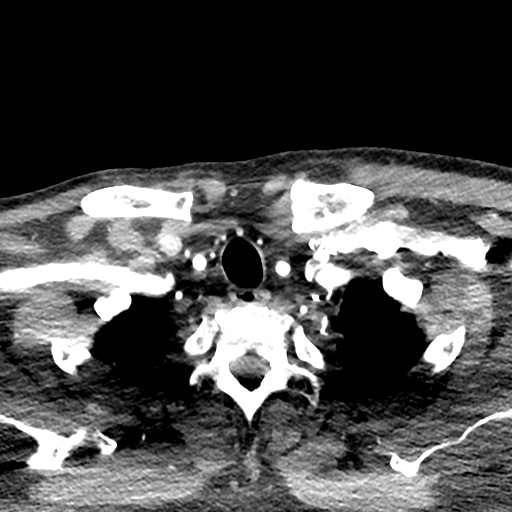
[im 99/148  soft-tissue]
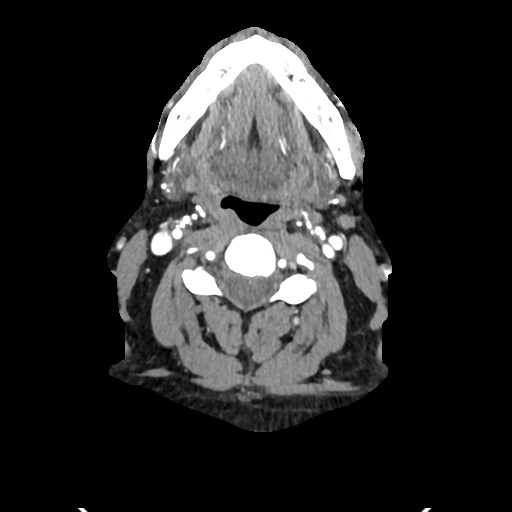

[Series 6: ax thin mips cta neck 1.00 ax · axial · 0.47mm/px · z∈[-718,-522]mm · 5 of 296 slices shown]
[im 50/296  soft-tissue]
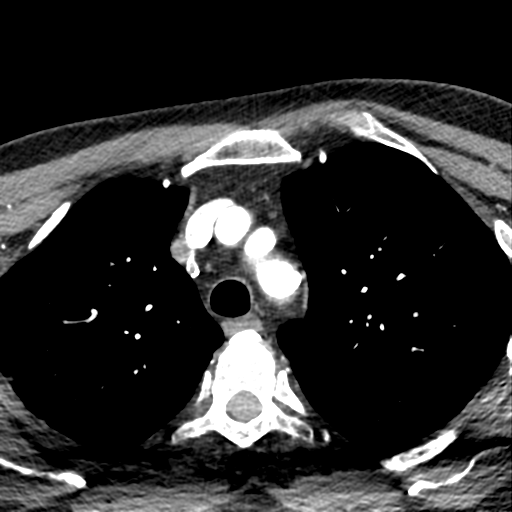
[im 99/296  bone]
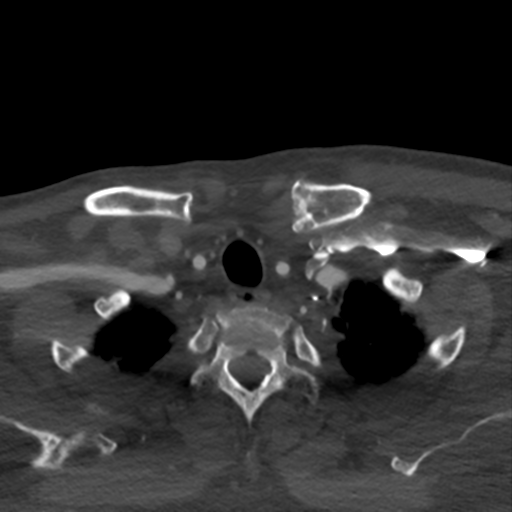
[im 148/296  soft-tissue]
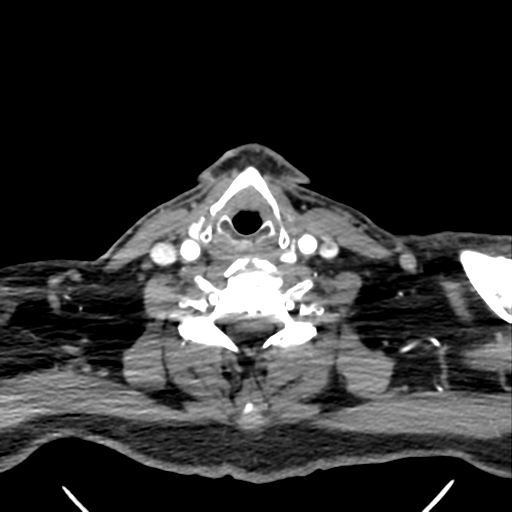
[im 197/296  bone]
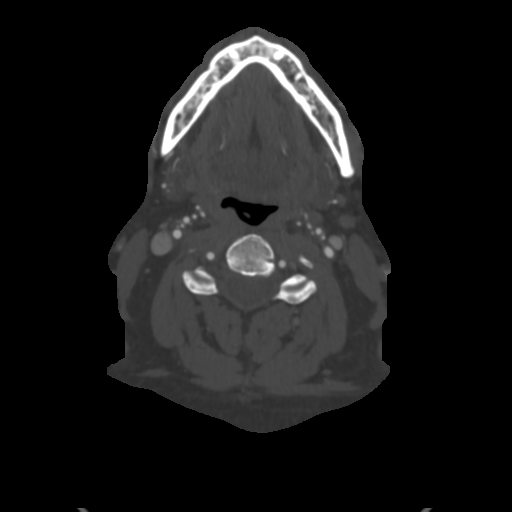
[im 246/296  soft-tissue]
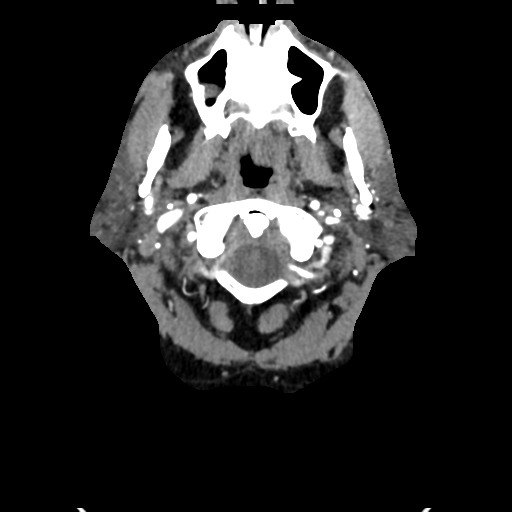

[Series 10: sag thin mips cta neck 1.00 sag · sagittal · 0.47mm/px · 1 of 242 slices shown]
[im 121/242  soft-tissue]
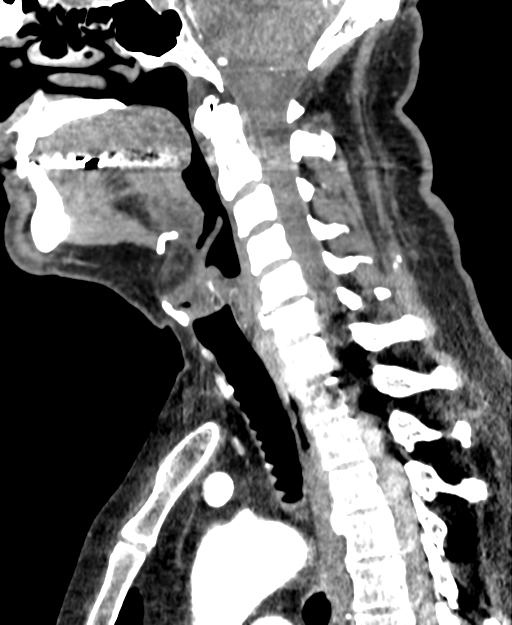

[8 of 33 positions shown; findings below may reference images not displayed]

RADIATION DOSE REDUCTION: This exam was performed according to the
departmental dose-optimization program which includes automated
exposure control, adjustment of the mA and/or kV according to
patient size and/or use of iterative reconstruction technique.

CONTRAST:  75mL OMNIPAQUE IOHEXOL 350 MG/ML SOLN
FINDINGS: Skeleton: Mild for age cervical spine degeneration. No acute osseous
abnormality identified. Visualized paranasal sinuses and mastoids
are well aerated.

Upper chest: Mild apical scarring and bronchial wall thickening but
otherwise negative visible upper lungs. No superior mediastinal
lymphadenopathy.

Other neck: Glottis is closed. No neck mass or lymphadenopathy
identified.

Grossly negative visible lower brain parenchyma, orbits.

Aortic arch: Calcified aortic atherosclerosis. Three vessel arch
configuration.

Right carotid system: No brachiocephalic or right CCA origin plaque.
At the right carotid bifurcation mild combined soft and calcified
plaque does not result in stenosis. Additional soft plaque of the
right ICA bulb without stenosis. Visible right ICA siphon is patent
with calcified plaque partially visible at the junction of the
cavernous and supraclinoid segments.

Left carotid system: No left CCA origin plaque or stenosis. Soft and
calcified plaque at the left carotid bifurcation is fairly mild in
the left ICA origin is patent without stenosis. However, there is
bulky mostly soft plaque in the left ICA bulb resulting in stenosis
numerically estimated at 70 % with respect to the distal vessel
(series 5, image 59). Distal to the bulb the left ICA is normally
patent to the skull base and the visible left siphon is patent with
only mild calcified plaque.

Vertebral arteries:
Proximal subclavian soft plaque appears ulcerated on series 8, image
123, but there is less than 50 % stenosis with respect to the distal
vessel. Normal right vertebral artery origin on series 8, image 140.
Right vertebral artery is patent to the vertebrobasilar junction
with no significant plaque or stenosis. Diminutive right PICA origin
is patent.

Visible basilar artery is patent along with the right AICA origin.

Mild plaque in the proximal left subclavian artery without stenosis.
Normal left vertebral artery origin on series 8, image 137. Tortuous
left V1 segment. Fairly codominant left vertebral artery is patent
to the vertebrobasilar junction with only mild plaque and no
significant stenosis. Normal left PICA origin.

Review of the MIP images confirms the above findings
IMPRESSION: 1. Left > Right carotid atherosclerosis. Bulky soft plaque in the
Left ICA bulb causes hemodynamically significant stenosis
numerically estimated at 70%.

2. No significant right Carotid or Vertebral artery stenosis. Soft
plaque at the Right Subclavian Artery origin is Ulcerated but
without significant stenosis.

3. Aortic Atherosclerosis (RU0TF-SIR.R).

## 2023-10-04 ENCOUNTER — Other Ambulatory Visit: Payer: Self-pay | Admitting: Specialist

## 2023-10-04 DIAGNOSIS — R911 Solitary pulmonary nodule: Secondary | ICD-10-CM

## 2023-10-07 NOTE — Progress Notes (Signed)
 Established Patient Visit   Chief Complaint: Chief Complaint  Patient presents with  . Chest Pain    Occas   . Dizziness    Frequently    Date of Service: 10/03/2023 Date of Birth: 1951/01/25 PCP: Valora Lynwood FALCON, MD  History of Present Illness: Mr. Bedel is a 73 y.o.male patient who hypertension sleep apnea hyperlipidemia prediabetes insomnia obesity peripheral vascular disease planes of chest pain has had some mild dyspnea dizziness.  Denies any blackout spells or syncope  Past Medical and Surgical History  Past Medical History Past Medical History:  Diagnosis Date  . Arthritis   . Depression   . Diverticulosis    Colonoscopy 09/05/11 (In transverse and ascending colon)  . Heart murmur   . Hyperlipidemia   . Hypertension   . Insomnia   . Sleep apnea    does not use and does not need it anymore  . Visual disturbance     Past Surgical History He has a past surgical history that includes Hernia repair; Knee arthroscopy; Amputation finger / thumb (Right); Head laceration repair; Arthroscopic partial medial meniscetomy and debridement (Right, 02/23/2015); corneal eye surgery (Bilateral, 09/24/2004); extraction cataract extracapsular w/insertion intraocular prosthesis (Left, 04/01/2015); exchange intraocular lens (Right, 04/28/2015); extraction cataract extracapsular w/insertion intraocular prosthesis (Right, 04/29/2015); and Colon @ [facility] (02/01/2023).   Medications and Allergies  Current Medications  Current Outpatient Medications  Medication Sig Dispense Refill  . albuterol  MDI, PROVENTIL , VENTOLIN , PROAIR , HFA 90 mcg/actuation inhaler INHALE 2 INHALATIONS INTO THE LUNGS EVERY 6 (SIX) HOURS AS NEEDED 18 each 5  . aspirin  81 MG EC tablet Take 81 mg by mouth once daily    . atorvastatin  (LIPITOR) 40 MG tablet TAKE 1/2 TABLET BY MOUTH EVERY DAY 45 tablet 3  . clopidogreL  (PLAVIX ) 75 mg tablet Take by mouth    . glipiZIDE (GLUCOTROL XL) 5 MG XL tablet Take 1 tablet (5 mg total) by  mouth once daily 90 tablet 3  . isosorbide  mononitrate (IMDUR ) 30 MG ER tablet TAKE 1 TABLET BY MOUTH EVERY DAY 30 tablet 0  . lamoTRIgine  (LAMICTAL  XR) 100 mg XR tablet Take 1 tablet (100 mg total) by mouth once daily 90 tablet 3  . metoprolol  SUCCinate (TOPROL -XL) 25 MG XL tablet Take 0.5 tablets (12.5 mg total) by mouth 2 (two) times daily Take 1/2 tab in the morning for a week then increase to 1/2 tab twice a day and continue that dose 90 tablet 3   No current facility-administered medications for this visit.    Allergies: Pantothenic acid, Thiopental, and Sodium pantothenate  Social and Family History  Social History  reports that he has never smoked. He has never used smokeless tobacco. He reports that he does not currently use alcohol. He reports that he does not use drugs.  Family History Family History  Problem Relation Name Age of Onset  . Lung cancer Mother    . Coronary Artery Disease (Blocked arteries around heart) Father  76       1st MI 73  . Lung cancer Sister         smoker  . Lung cancer Brother         X 2,smokers  . Coronary Artery Disease (Blocked arteries around heart) Paternal Uncle  60       x 2  . Cataracts Neg Hx    . Glaucoma Neg Hx      Review of Systems   Review of Systems: The patient denies chest pain, shortness  of breath, orthopnea, paroxysmal nocturnal dyspnea, pedal edema, palpitations, heart racing, presyncope, syncope. Review of 12 Systems is negative except as described above.  Physical Examination   Vitals:BP (!) 140/80 (BP Location: Right forearm)   Pulse 68   Resp 16   Wt (!) 115.8 kg (255 lb 3.2 oz)   SpO2 98%   BMI 31.90 kg/m  Ht:  Wt:(!) 115.8 kg (255 lb 3.2 oz) ADJ:Anib surface area is 2.48 meters squared. Body mass index is 31.9 kg/m.  HEENT: Pupils equally reactive to light and accomodation  Neck: Supple without thyromegaly, carotid pulses 2+ Lungs: clear to auscultation bilaterally; no wheezes, rales, rhonchi Heart:  Regular rate and rhythm.  No gallops, murmurs or rub Abdomen: soft nontender, nondistended, with normal bowel sounds Extremities: no cyanosis, clubbing, or edema Peripheral Pulses: 2+ in all extremities, 2+ femoral pulses bilaterally Neurologic: Alert and oriented X3; speech intact; face symmetrical; moves all extremities well  Assessment   73 y.o. male with  1. Hypertension, unspecified type   2. Chest pain at rest   3. SOB (shortness of breath)   4. OSA (obstructive sleep apnea)   5. Dizziness   6. Primary hypertension   7. Mixed hyperlipidemia   8. Non-insulin  dependent type 2 diabetes mellitus (CMS/HHS-HCC)   9. Syncope, unspecified syncope type   10. Dyspnea on exertion   11. PVD (peripheral vascular disease) (CMS-HCC)   12. Bilateral carotid bruits        Plan  Chest pain possible angina recommend cardiac CTA with FFR for evaluation arteriosclerotic vascular disease Cardiogram for shortness of breath dyspnea valvular structures and left ventricular function Hypertension reasonably controlled metoprolol  Imdur  Hyperlipidemia agree with Lipitor therapy for lipid management continue diet and exercise Diabetes type 2 uncomplicated recommend glipizide follow diabetic diet with exercise Obesity recommend weight loss exercise portion control Shortness of breath recommend inhalers as necessary modest weight loss consider diuretics Peripheral vascular disease continue aspirin  Plavix  statin blood pressure control Have the patient follow-up in 3 months     Return in about 3 months (around 01/01/2024).  DWAYNE D CALLWOOD, MD  This dictation was prepared with dragon dictation.  Any transcription errors that result from this process are unintentional.

## 2023-10-18 ENCOUNTER — Ambulatory Visit
Admission: RE | Admit: 2023-10-18 | Discharge: 2023-10-18 | Disposition: A | Discharge status: Home / Self Care | Payer: Medicare Other | Source: Ambulatory Visit | Attending: Specialist | Admitting: Specialist

## 2023-10-18 DIAGNOSIS — R911 Solitary pulmonary nodule: Secondary | ICD-10-CM | POA: Diagnosis present

## 2023-10-22 ENCOUNTER — Other Ambulatory Visit: Payer: Self-pay | Admitting: Internal Medicine

## 2023-10-22 DIAGNOSIS — R079 Chest pain, unspecified: Secondary | ICD-10-CM

## 2023-10-25 ENCOUNTER — Telehealth (HOSPITAL_COMMUNITY): Payer: Self-pay | Admitting: Emergency Medicine

## 2023-10-25 NOTE — Telephone Encounter (Signed)
 Reaching out to patient to offer assistance regarding upcoming cardiac imaging study; pt verbalizes understanding of appt date/time, parking situation and where to check in, pre-test NPO status and medications ordered, and verified current allergies; name and call back number provided for further questions should they arise Rockwell Alexandria RN Navigator Cardiac Imaging [facility] [phone] office [phone] cell

## 2023-10-28 ENCOUNTER — Ambulatory Visit
Admission: RE | Admit: 2023-10-28 | Discharge: 2023-10-28 | Disposition: A | Discharge status: Home / Self Care | Payer: Medicare Other | Source: Ambulatory Visit | Attending: Internal Medicine | Admitting: Internal Medicine

## 2023-10-28 DIAGNOSIS — I2510 Atherosclerotic heart disease of native coronary artery without angina pectoris: Secondary | ICD-10-CM | POA: Insufficient documentation

## 2023-10-28 DIAGNOSIS — R079 Chest pain, unspecified: Secondary | ICD-10-CM | POA: Insufficient documentation

## 2023-10-28 LAB — POCT I-STAT CREATININE: Creatinine, Ser: 1.1 mg/dL (ref 0.61–1.24)

## 2023-10-28 MED ORDER — DILTIAZEM HCL 25 MG/5ML IV SOLN
10.0000 mg | INTRAVENOUS | Status: DC | PRN
  Filled 2023-10-28: qty 5

## 2023-10-28 MED ORDER — NITROGLYCERIN 0.4 MG SL SUBL
0.8000 mg | SUBLINGUAL_TABLET | Freq: Once | SUBLINGUAL | Status: AC
Start: 2023-10-28 — End: 2023-10-28
  Administered 2023-10-28: 0.8 mg via SUBLINGUAL
  Filled 2023-10-28: qty 25

## 2023-10-28 MED ORDER — NITROGLYCERIN 0.4 MG SL SUBL
SUBLINGUAL_TABLET | SUBLINGUAL | Status: AC
  Filled 2023-10-28: qty 2

## 2023-10-28 MED ORDER — METOPROLOL TARTRATE 5 MG/5ML IV SOLN
10.0000 mg | Freq: Once | INTRAVENOUS | Status: DC | PRN
  Filled 2023-10-28: qty 10

## 2023-10-28 MED ORDER — IOHEXOL 350 MG/ML SOLN
80.0000 mL | Freq: Once | INTRAVENOUS | Status: AC | PRN
Start: 2023-10-28 — End: 2023-10-28
  Administered 2023-10-28: 80 mL via INTRAVENOUS

## 2023-10-28 NOTE — Progress Notes (Signed)
 Pt tolerated exam without incident; vital signs within normal limits; pt denies lightheadedness or dizziness; encouraged to drink caffeine, eat meal; pt ambulatory to lobby steady gait noted

## 2023-11-27 ENCOUNTER — Other Ambulatory Visit (INDEPENDENT_AMBULATORY_CARE_PROVIDER_SITE_OTHER): Payer: Self-pay | Admitting: Vascular Surgery

## 2023-11-27 DIAGNOSIS — I6523 Occlusion and stenosis of bilateral carotid arteries: Secondary | ICD-10-CM

## 2023-11-29 ENCOUNTER — Ambulatory Visit (INDEPENDENT_AMBULATORY_CARE_PROVIDER_SITE_OTHER): Payer: Medicare Other

## 2023-11-29 ENCOUNTER — Ambulatory Visit (INDEPENDENT_AMBULATORY_CARE_PROVIDER_SITE_OTHER): Payer: Medicare Other | Admitting: Vascular Surgery

## 2023-11-29 ENCOUNTER — Encounter (INDEPENDENT_AMBULATORY_CARE_PROVIDER_SITE_OTHER): Payer: Self-pay | Admitting: Vascular Surgery

## 2023-11-29 VITALS — BP 115/71 | HR 50 | Resp 18 | Ht 76.0 in | Wt 260.0 lb

## 2023-11-29 DIAGNOSIS — I6522 Occlusion and stenosis of left carotid artery: Secondary | ICD-10-CM

## 2023-11-29 DIAGNOSIS — I6523 Occlusion and stenosis of bilateral carotid arteries: Secondary | ICD-10-CM

## 2023-11-29 DIAGNOSIS — I10 Essential (primary) hypertension: Secondary | ICD-10-CM

## 2023-11-29 DIAGNOSIS — E785 Hyperlipidemia, unspecified: Secondary | ICD-10-CM | POA: Diagnosis not present

## 2023-11-29 NOTE — Progress Notes (Signed)
 MRN : [id number]  Randy Shaffer is a 73 y.o. (1951/07/03) male who presents with chief complaint of  Chief Complaint  Patient presents with   Follow-up    6 months carotid  .  History of Present Illness: patient returns in follow up of his carotid disease.  He is doing well.  He is about a year and a half s/p left carotid stent placement.  His dizziness is better.  He has no focal neurologic symptoms. Specifically, the patient denies amaurosis fugax, speech or swallowing difficulties, or arm or leg weakness or numbness. Carotid duplex today shows no significant right ICA stenosis and a patent left carotid stent.  Current Outpatient Medications  Medication Sig Dispense Refill   aspirin EC 81 MG tablet Take 81 mg by mouth daily. Swallow whole.     atorvastatin (LIPITOR) 40 MG tablet Take 20 mg by mouth in the morning.     clopidogrel (PLAVIX) 75 MG tablet TAKE 1 TABLET BY MOUTH EVERY DAY 90 tablet 2   lamoTRIgine (LAMICTAL) 25 MG tablet Take 25 mg by mouth daily.     metoprolol succinate (TOPROL-XL) 25 MG 24 hr tablet Take 12.5 mg by mouth in the morning and at bedtime.     mirtazapine (REMERON) 15 MG tablet Take 15 mg by mouth 2 (two) times daily.     PARoxetine (PAXIL) 20 MG tablet TAKE 1/2 TAB DAILY FOR A WEEK THEN INCREASE TO 1 TAB DAILY AND CONTINUE THAT DOSE     traZODone (DESYREL) 50 MG tablet      No current facility-administered medications for this visit.    Past Medical History:  Diagnosis Date   Anginal pain (HCC)    Aortic atherosclerosis (HCC)    BPH (benign prostatic hyperplasia)    CAD (coronary artery disease) 12/13/2021   a.) R/LHC 12/13/2021: EF 55-65%, 25% dLM-oLAD, 25% pLAD, 50% m-dLAD; mPA 20, mPCWP 10, Ao sat 96, PA sat 71, CO 5.7.   Cardiac murmur    Carotid artery disease (HCC)    a.) CTA neck 03/02/2022: 70% LICA; b.) PTA 04/19/2022: 80% LICA --> POBA performed  followed by placement of a 9 mm prox, 7mm distal, 4 cm long exact stent placed; 10-15%  residual stenosis; c.) carotid doppler 05/22/2022: 1-39% BICA   Complication of anesthesia    a.) PONV   Depression    Diet-controlled type 2 diabetes mellitus (HCC)    Diverticulosis 09/05/2011   DOE (dyspnea on exertion)    History of bilateral cataract extraction    HLD (hyperlipidemia)    Hypertension    Insomnia    a.) takes trazodone PRN   Long term current use of antithrombotics/antiplatelets    a.) on daily DAPT (ASA + clopidogrel)   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    OA (osteoarthritis) of knee    PONV (postoperative nausea and vomiting)    SEVERE   Sleep apnea    a.) unable to tolerate nocturnal PAP therapy    Past Surgical History:  Procedure Laterality Date   AMPUTATION FINGER / THUMB Right    THUMB PLUS 3 RECONSTRUCTIVE SURGERIES   CAROTID PTA/STENT INTERVENTION Left 04/16/2022   Procedure: CAROTID PTA/STENT INTERVENTION;  Surgeon: Annice Needy, MD;  Location: [facility];  Service: Cardiovascular;  Laterality: Left;   COLONOSCOPY WITH PROPOFOL N/A 02/01/2023   Procedure: COLONOSCOPY WITH PROPOFOL;  Surgeon: Regis Bill, MD;  Location: [facility];  Service: Endoscopy;  Laterality: N/A;   CORONARY ANGIOPLASTY  HEAD LACERATION REPAIR     HERNIA REPAIR  09/24/1998   ONE AS INFANT   HIP NERVE RELEASE     HOLEP-LASER ENUCLEATION OF THE PROSTATE WITH MORCELLATION N/A 08/03/2022   Procedure: HOLEP-LASER ENUCLEATION OF THE PROSTATE WITH MORCELLATION;  Surgeon: Sondra Come, MD;  Location: [facility];  Service: Urology;  Laterality: N/A;   KNEE ARTHROSCOPY Right 09/24/2001   KNEE ARTHROSCOPY Right 02/23/2015   Procedure: ARTHROSCOPY KNEE WITH DEBRIDEMENT PARTIAL MEDIAL MENISECTOMY;  Surgeon: Christena Flake, MD;  Location: [facility];  Service: Orthopedics;  Laterality: Right;   PHOTOREFRACTIVE KERATOTOMY     RIGHT/LEFT HEART CATH AND CORONARY ANGIOGRAPHY N/A 12/13/2021   Procedure: RIGHT/LEFT HEART CATH AND CORONARY ANGIOGRAPHY;  Surgeon:  Alwyn Pea, MD;  Location: [facility];  Service: Cardiovascular;  Laterality: N/A;     Social History   Tobacco Use   Smoking status: Never    Passive exposure: Never   Smokeless tobacco: Never  Vaping Use   Vaping status: Never Used  Substance Use Topics   Alcohol use: Yes    Comment: 2-3 BEER/MONTH   Drug use: No       Family History  Problem Relation Age of Onset   Heart disease Mother    Cancer Mother    Heart attack Father    Cancer Sister    Cancer Brother    Heart attack Maternal Uncle      Allergies  Allergen Reactions   Thiopental Sodium [Thiopental] Nausea And Vomiting    SEVERE VOMITING     REVIEW OF SYSTEMS (Negative unless checked)  Constitutional: [] Weight loss  [] Fever  [] Chills Cardiac: [] Chest pain   [] Chest pressure   [] Palpitations   [] Shortness of breath when laying flat   [] Shortness of breath at rest   [] Shortness of breath with exertion. Vascular:  [] Pain in legs with walking   [] Pain in legs at rest   [] Pain in legs when laying flat   [] Claudication   [] Pain in feet when walking  [] Pain in feet at rest  [] Pain in feet when laying flat   [] History of DVT   [] Phlebitis   [] Swelling in legs   [] Varicose veins   [] Non-healing ulcers Pulmonary:   [] Uses home oxygen   [] Productive cough   [] Hemoptysis   [] Wheeze  [] COPD   [] Asthma Neurologic:  [x] Dizziness  [] Blackouts   [] Seizures   [] History of stroke   [] History of TIA  [] Aphasia   [] Temporary blindness   [] Dysphagia   [] Weakness or numbness in arms   [] Weakness or numbness in legs Musculoskeletal:  [x] Arthritis   [] Joint swelling   [] Joint pain   [] Low back pain Hematologic:  [] Easy bruising  [] Easy bleeding   [] Hypercoagulable state   [] Anemic  [] Hepatitis Gastrointestinal:  [] Blood in stool   [] Vomiting blood  [] Gastroesophageal reflux/heartburn   [] Difficulty swallowing. Genitourinary:  [] Chronic kidney disease   [] Difficult urination  [] Frequent urination  [] Burning with  urination   [] Blood in urine Skin:  [] Rashes   [] Ulcers   [] Wounds Psychological:  [] History of anxiety   []  History of major depression.  Physical Examination  Vitals:   11/29/23 0831  BP: 115/71  Pulse: (!) 50  Resp: 18  Weight: 260 lb (117.9 kg)  Height: 6\' 4"  (1.93 m)   Body mass index is 31.65 kg/m. Gen:  WD/WN, NAD Head: [place]/AT, No temporalis wasting. Ear/Nose/Throat: Hearing grossly intact, nares w/o erythema or drainage, trachea midline Eyes: Conjunctiva clear. Sclera non-icteric  Neck: Supple.  No bruit  Pulmonary:  Good air movement, equal and clear to auscultation bilaterally.  Cardiac: RRR, No JVD Vascular:  Vessel Right Left  Radial Palpable Palpable       Musculoskeletal: M/S 5/5 throughout.  No deformity or atrophy. No edema. Neurologic: CN 2-12 intact. Sensation grossly intact in extremities.  Symmetrical.  Speech is fluent. Motor exam as listed above. Psychiatric: Judgment intact, Mood & affect appropriate for pt's clinical situation. Dermatologic: No rashes or ulcers noted.  No cellulitis or open wounds.     CBC Lab Results  Component Value Date   WBC 5.8 04/03/2023   HGB 16.9 04/03/2023   HCT 49.6 04/03/2023   MCV 87.0 04/03/2023   PLT 208 04/03/2023    BMET    Component Value Date/Time   NA 139 04/03/2023 1414   NA 144 02/16/2013 1623   K 4.0 04/03/2023 1414   K 4.2 02/16/2013 1623   CL 105 04/03/2023 1414   CL 108 (H) 02/16/2013 1623   CO2 23 04/03/2023 1414   CO2 29 02/16/2013 1623   GLUCOSE 155 (H) 04/03/2023 1414   GLUCOSE 103 (H) 02/16/2013 1623   BUN 21 04/03/2023 1414   BUN 24 (H) 02/16/2013 1623   CREATININE 1.10 10/28/2023 0819   CREATININE 1.29 02/16/2013 1623   CALCIUM 9.1 04/03/2023 1414   CALCIUM 9.6 02/16/2013 1623   GFRNONAA >60 04/03/2023 1414   GFRNONAA 59 (L) 02/16/2013 1623   GFRAA >60 02/16/2013 1623   CrCl cannot be calculated (Patient's most recent lab result is older than the maximum 21 days  allowed.).  COAG No results found for: "INR", "PROTIME"  Radiology No results found.   Assessment/Plan Carotid stenosis Carotid duplex today shows no significant right ICA stenosis and a patent left carotid stent. Continue medications as prescribed. Check annually at this point.   Hypertension blood pressure control important in reducing the progression of atherosclerotic disease. On appropriate oral medications.  No hemodynamically significant renal artery stenosis identified in either kidney with normal kidney length seen bilaterally.  This would suggest this is not renovascular hypertension.     Hyperlipidemia lipid control important in reducing the progression of atherosclerotic disease. Continue statin therapy  Festus Barren, MD  11/29/2023 9:29 AM    This note was created with Dragon medical transcription system.  Any errors from dictation are purely unintentional

## 2023-11-29 NOTE — Assessment & Plan Note (Signed)
 Carotid duplex today shows no significant right ICA stenosis and a patent left carotid stent. Continue medications as prescribed. Check annually at this point.

## 2024-02-11 ENCOUNTER — Encounter (INDEPENDENT_AMBULATORY_CARE_PROVIDER_SITE_OTHER): Payer: Self-pay

## 2024-03-01 ENCOUNTER — Emergency Department
Admission: EM | Admit: 2024-03-01 | Discharge: 2024-03-01 | Disposition: A | Discharge status: Home / Self Care | Attending: Emergency Medicine | Admitting: Emergency Medicine

## 2024-03-01 ENCOUNTER — Emergency Department

## 2024-03-01 DIAGNOSIS — Z7901 Long term (current) use of anticoagulants: Secondary | ICD-10-CM | POA: Diagnosis not present

## 2024-03-01 DIAGNOSIS — I10 Essential (primary) hypertension: Secondary | ICD-10-CM | POA: Diagnosis not present

## 2024-03-01 DIAGNOSIS — X58XXXA Exposure to other specified factors, initial encounter: Secondary | ICD-10-CM | POA: Diagnosis not present

## 2024-03-01 DIAGNOSIS — Z955 Presence of coronary angioplasty implant and graft: Secondary | ICD-10-CM | POA: Insufficient documentation

## 2024-03-01 DIAGNOSIS — S6991XA Unspecified injury of right wrist, hand and finger(s), initial encounter: Secondary | ICD-10-CM | POA: Insufficient documentation

## 2024-03-01 LAB — BASIC METABOLIC PANEL WITH GFR
Anion gap: 11 (ref 5–15)
BUN: 22 mg/dL (ref 8–23)
CO2: 22 mmol/L (ref 22–32)
Calcium: 9.3 mg/dL (ref 8.9–10.3)
Chloride: 105 mmol/L (ref 98–111)
Creatinine, Ser: 1.17 mg/dL (ref 0.61–1.24)
GFR, Estimated: >60 mL/min (ref >60)
Glucose, Bld: 112 mg/dL — ABNORMAL HIGH (ref 70–99)
Potassium: 4.1 mmol/L (ref 3.5–5.1)
Sodium: 138 mmol/L (ref 135–145)

## 2024-03-01 LAB — CBC WITH DIFFERENTIAL/PLATELET
Abs Immature Granulocytes: 0.01 10*3/uL (ref 0.00–0.07)
Basophils Absolute: 0 10*3/uL (ref 0.0–0.1)
Basophils Relative: 1 %
Eosinophils Absolute: 0.1 10*3/uL (ref 0.0–0.5)
Eosinophils Relative: 1 %
HCT: 44.2 % (ref 39.0–52.0)
Hemoglobin: 15.4 g/dL (ref 13.0–17.0)
Immature Granulocytes: 0 %
Lymphocytes Relative: 18 %
Lymphs Abs: 1 10*3/uL (ref 0.7–4.0)
MCH: 30.1 pg (ref 26.0–34.0)
MCHC: 34.8 g/dL (ref 30.0–36.0)
MCV: 86.3 fL (ref 80.0–100.0)
Monocytes Absolute: 0.5 10*3/uL (ref 0.1–1.0)
Monocytes Relative: 9 %
Neutro Abs: 3.7 10*3/uL (ref 1.7–7.7)
Neutrophils Relative %: 71 %
Platelets: 235 10*3/uL (ref 150–400)
RBC: 5.12 MIL/uL (ref 4.22–5.81)
RDW: 12.5 % (ref 11.5–15.5)
WBC: 5.2 10*3/uL (ref 4.0–10.5)
nRBC: 0 % (ref 0.0–0.2)

## 2024-03-01 LAB — PROTIME-INR
INR: 1 (ref 0.8–1.2)
Prothrombin Time: 13.3 s (ref 11.4–15.2)

## 2024-03-01 MED ORDER — TETANUS-DIPHTH-ACELL PERTUSSIS 5-2.5-18.5 LF-MCG/0.5 IM SUSY: 0.5000 mL | PREFILLED_SYRINGE | Freq: Once | INTRAMUSCULAR | Status: DC

## 2024-03-01 MED ORDER — ONDANSETRON HCL 4 MG/2ML IJ SOLN
4.0000 mg | Freq: Once | INTRAMUSCULAR | Status: AC
  Administered 2024-03-01: 4 mg via INTRAVENOUS
  Filled 2024-03-01: qty 2

## 2024-03-01 MED ORDER — HYDROMORPHONE HCL 1 MG/ML IJ SOLN
0.5000 mg | Freq: Once | INTRAMUSCULAR | Status: AC
  Administered 2024-03-01: 0.5 mg via INTRAVENOUS
  Filled 2024-03-01: qty 0.5

## 2024-03-01 NOTE — ED Triage Notes (Signed)
 Pt states that he was approximately 74' high on an extension ladder when the two sections started moving and his R arm came trapped between the rungs of the two sections. Pt has obvious deformity to R wrist. Pt denies fall. No other injury per pt. Pt anticoagulated on Warfarin.

## 2024-03-01 NOTE — Discharge Instructions (Signed)
 Keep the area clean, elevated above your head use ice to keep the swelling down and return to the emergency room if you develop worsening symptoms fevers or any other concerns

## 2024-03-01 NOTE — ED Provider Notes (Signed)
 [facility] Provider Note    Event Date/Time   First MD Initiated Contact with Patient 03/01/24 1537     (approximate)   History   Arm Injury   HPI  Randy Shaffer is a 73 y.o. male with history of carotid stent placement, hypertension, hyperlipidemia who comes in with arm injury.  Report the patient is on warfarin however when I reviewed the records I see a prescription for Plavix  I suspect it is actually Plavix  and not warfarin will confirm with INR.  Patient reports that he was on a ladder when the extension part of the ladder came down onto his right wrist.  He reports severe pain in this area.  He still able to move his fingers has a good pulse.  Discussed some abrasions on the hand but no deep cuts.  He states that he did not fall off the ladder and that he was able to lift the ladder up and get his hand out.  Did not injure anything other than his right wrist.  He is left-handed.   Physical Exam   Triage Vital Signs: ED Triage Vitals [03/01/24 1535]  Encounter Vitals Group     BP (!) 172/84     Systolic BP Percentile      Diastolic BP Percentile      Pulse Rate 75     Resp 20     Temp 98.5 F (36.9 C)     Temp Source Oral     SpO2 96 %     Weight 243 lb (110.2 kg)     Height 6\' 4"  (1.93 m)     Head Circumference      Peak Flow      Pain Score 10     Pain Loc      Pain Education      Exclude from Growth Chart     Most recent vital signs: Vitals:   03/01/24 1535  BP: (!) 172/84  Pulse: 75  Resp: 20  Temp: 98.5 F (36.9 C)  SpO2: 96%     General: Awake, no distress.  CV:  Good peripheral perfusion.  Resp:  Normal effort.  Abd:  No distention.  Other:  Patient has been hematoma and bruising noted around the right wrist.  He is got good distal pulse, able to wiggle his fingers.  There is an abrasion on the dorsum of the hand that was cleaned off without any evidence of puncture wound.      ED Results / Procedures /  Treatments   Labs (all labs ordered are listed, but only abnormal results are displayed) Labs Reviewed  BASIC METABOLIC PANEL WITH GFR - Abnormal; Notable for the following components:      Result Value   Glucose, Bld 112 (*)    All other components within normal limits  CBC WITH DIFFERENTIAL/PLATELET  PROTIME-INR      RADIOLOGY I have reviewed the xray personally and interpreted no fracture    PROCEDURES:  Critical Care performed: No  Procedures   MEDICATIONS ORDERED IN ED: Medications  Tdap (BOOSTRIX) injection 0.5 mL (has no administration in time range)  HYDROmorphone  (DILAUDID ) injection 0.5 mg (0.5 mg Intravenous Given 03/01/24 1601)  ondansetron  (ZOFRAN ) injection 4 mg (4 mg Intravenous Given 03/01/24 1601)     IMPRESSION / MDM / ASSESSMENT AND PLAN / ED COURSE  I reviewed the triage vital signs and the nursing notes.   Patient's presentation is most consistent with acute, uncomplicated  illness.   Patient comes in with hand injury.  He is neurovascularly intact.  X-ray was ordered evaluate for fracture, dislocation.  X-ray was negative.  Patient given some IV Dilaudid  to help with pain on repeat evaluations patient's hematoma has actually come down in size.  Do not feel there is an active arterial bleed given the swelling has come down.  On repeat assessment he remains neurovascularly intact.  Offered to wrap it here and patient declined stated he would go home and has some cream he will put on it and wrap it at home  Patient declined any pain medication for home.    FINAL CLINICAL IMPRESSION(S) / ED DIAGNOSES   Final diagnoses:  Injury of right hand, initial encounter     Rx / DC Orders   ED Discharge Orders     None        Note:  This document was prepared using Dragon voice recognition software and may include unintentional dictation errors.   Lubertha Rush, MD 03/01/24 [phone]

## 2024-03-10 ENCOUNTER — Other Ambulatory Visit
Admission: RE | Admit: 2024-03-10 | Discharge: 2024-03-10 | Disposition: A | Discharge status: Home / Self Care | Attending: Urology | Admitting: Urology

## 2024-03-10 ENCOUNTER — Ambulatory Visit: Admitting: Urology

## 2024-03-10 VITALS — BP 119/68 | HR 71 | Ht 76.0 in | Wt 248.3 lb

## 2024-03-10 DIAGNOSIS — R310 Gross hematuria: Secondary | ICD-10-CM | POA: Diagnosis present

## 2024-03-10 DIAGNOSIS — N323 Diverticulum of bladder: Secondary | ICD-10-CM | POA: Diagnosis not present

## 2024-03-10 DIAGNOSIS — N138 Other obstructive and reflux uropathy: Secondary | ICD-10-CM | POA: Diagnosis present

## 2024-03-10 DIAGNOSIS — N401 Enlarged prostate with lower urinary tract symptoms: Secondary | ICD-10-CM

## 2024-03-10 LAB — BLADDER SCAN AMB NON-IMAGING: Scan Result: 277

## 2024-03-10 LAB — URINALYSIS, COMPLETE (UACMP) WITH MICROSCOPIC
Bacteria, UA: NONE SEEN
Bilirubin Urine: NEGATIVE
Glucose, UA: NEGATIVE mg/dL
Hgb urine dipstick: NEGATIVE
Ketones, ur: NEGATIVE mg/dL
Leukocytes,Ua: NEGATIVE
Nitrite: NEGATIVE
Protein, ur: NEGATIVE mg/dL
RBC / HPF: NONE SEEN RBC/hpf (ref 0–5)
Specific Gravity, Urine: 1.015 (ref 1.005–1.030)
pH: 7 (ref 5.0–8.0)

## 2024-03-10 NOTE — Progress Notes (Signed)
   03/10/2024 1:31 PM   Randy Shaffer 15-Apr-1951 [id number]  Reason for visit: Follow up pelvic pain, bladder diverticulum, history of BPH  HPI: With history of elevated PVRs and obstructive symptoms refractory to medical management who underwent HOLEP in November 2023 with removal of 35 g benign tissue, preop PVRs were >455ml.  He has a known large posterior bladder diverticulum, and has had some persistent incomplete emptying of this that is visualized on PVRs in clinic.  He is voiding spontaneously, but PVRs have ranged from 150-300 postop and ultrasound imaging consistent with incomplete emptying of the posterior diverticulum.  He had an episode of prostatitis in October 2024 that resolved with antibiotics.  He had cystoscopy and CT urogram secondary to his significant gross hematuria and his concern for his family history of bladder cancer which showed an open prostatic fossa, and a posterior bladder wall diverticulum but no mucosal abnormalities.  CT showed large posterior bladder diverticulum but no other abnormal findings.  He reports persistent sensation of incomplete emptying, especially overnight, bladder scan 275 mL again today consistent with posterior bladder diverticulum.  Urinalysis today is benign.  He is interested in surgical options for his bladder diverticulum, he is also concerned about his family history of bladder cancer and would like this removed.  We discussed that his cystoscopy was benign in October 2024 with no evidence of malignancy.  He also has pelvic pain that he attributes to the bladder diverticulum.  I did very frank conversation with the patient that though I think diverticulectomy would improve his PVRs and potentially some of his residual urinary symptoms, this may not improve some of his pelvic pain issues.  Referral placed to [facility] for consideration of robotic diverticulectomy   Lawerence Pressman, MD  [facility] [address]  [address] [place], [place] [address] [phone]

## 2024-04-23 ENCOUNTER — Telehealth: Payer: Self-pay | Admitting: Pulmonary Disease

## 2024-04-23 NOTE — Telephone Encounter (Signed)
 LVMTCB to schedule pulmonary consult.

## 2024-05-19 ENCOUNTER — Ambulatory Visit: Admitting: Pulmonary Disease

## 2024-05-19 ENCOUNTER — Encounter: Payer: Self-pay | Admitting: Pulmonary Disease

## 2024-05-19 ENCOUNTER — Other Ambulatory Visit: Payer: Self-pay | Admitting: Urology

## 2024-05-19 VITALS — BP 136/86 | HR 61 | Temp 97.9°F | Ht 76.0 in | Wt 252.6 lb

## 2024-05-19 DIAGNOSIS — Z8669 Personal history of other diseases of the nervous system and sense organs: Secondary | ICD-10-CM | POA: Diagnosis not present

## 2024-05-19 DIAGNOSIS — R0609 Other forms of dyspnea: Secondary | ICD-10-CM | POA: Diagnosis not present

## 2024-05-19 NOTE — Patient Instructions (Addendum)
 VISIT SUMMARY:  Today, we discussed your ongoing shortness of breath, which has been present for about four years and worsened since 2021. We reviewed your history of lung nodules, cardiovascular issues, and sleep apnea. Your oxygen levels remain stable, and we discussed the potential causes of your symptoms, including your heart's response to exercise and untreated sleep apnea.  YOUR PLAN:  -CHRONIC EXERTIONAL DYSPNEA: Chronic exertional dyspnea means experiencing shortness of breath during physical activity. Your oxygen levels are stable, but we need to understand how your heart and lungs work together during exercise. We will schedule a cardiopulmonary stress test and a pulmonary function test in the next 4-6 weeks to gather more information.  -BLUNTED CHRONOTROPIC RESPONSE TO EXERCISE: Blunted chronotropic response to exercise means your heart rate does not increase as it should during physical activity. This could be related to your current heart medications.  Consider reconsulting with your cardiologist to review your medications and consider adjusting your beta blocker dosage or type.  -OBSTRUCTIVE SLEEP APNEA, UNTREATED: Obstructive sleep apnea is a condition where your breathing stops and starts during sleep. You have had this since childhood but have not used a CPAP machine due to discomfort. This condition may be contributing to your daytime shortness of breath and fatigue.  INSTRUCTIONS:  Please follow up with the cardiologist to review your heart medications. We will also schedule a cardiopulmonary stress test and a pulmonary function test in the next 4-6 weeks. If you have any new symptoms or concerns, please contact our office.
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
## 2024-06-03 ENCOUNTER — Ambulatory Visit (HOSPITAL_COMMUNITY): Attending: Cardiology

## 2024-06-03 DIAGNOSIS — I10 Essential (primary) hypertension: Secondary | ICD-10-CM | POA: Insufficient documentation

## 2024-06-03 DIAGNOSIS — I739 Peripheral vascular disease, unspecified: Secondary | ICD-10-CM | POA: Insufficient documentation

## 2024-06-03 DIAGNOSIS — I6529 Occlusion and stenosis of unspecified carotid artery: Secondary | ICD-10-CM | POA: Diagnosis not present

## 2024-06-03 DIAGNOSIS — R0609 Other forms of dyspnea: Secondary | ICD-10-CM | POA: Diagnosis not present

## 2024-06-03 DIAGNOSIS — R0600 Dyspnea, unspecified: Secondary | ICD-10-CM | POA: Diagnosis present

## 2024-06-05 DIAGNOSIS — R0609 Other forms of dyspnea: Secondary | ICD-10-CM | POA: Diagnosis not present

## 2024-06-06 ENCOUNTER — Ambulatory Visit: Payer: Self-pay | Admitting: Pulmonary Disease

## 2024-07-09 ENCOUNTER — Encounter: Payer: Self-pay | Admitting: Pulmonary Disease

## 2024-07-09 ENCOUNTER — Ambulatory Visit

## 2024-07-09 ENCOUNTER — Ambulatory Visit: Admitting: Pulmonary Disease

## 2024-07-09 VITALS — BP 124/74 | HR 65 | Temp 98.0°F | Ht 76.0 in | Wt 252.4 lb

## 2024-07-09 DIAGNOSIS — G4733 Obstructive sleep apnea (adult) (pediatric): Secondary | ICD-10-CM | POA: Diagnosis not present

## 2024-07-09 DIAGNOSIS — R0609 Other forms of dyspnea: Secondary | ICD-10-CM

## 2024-07-09 DIAGNOSIS — E669 Obesity, unspecified: Secondary | ICD-10-CM

## 2024-07-09 DIAGNOSIS — J3089 Other allergic rhinitis: Secondary | ICD-10-CM

## 2024-07-09 DIAGNOSIS — R0982 Postnasal drip: Secondary | ICD-10-CM

## 2024-07-09 DIAGNOSIS — Z6830 Body mass index (BMI) 30.0-30.9, adult: Secondary | ICD-10-CM

## 2024-07-09 LAB — PULMONARY FUNCTION TEST
DL/VA % pred: 109 %
DL/VA: 4.21 ml/min/mmHg/L
DLCO unc % pred: 94 %
DLCO unc: 28.5 ml/min/mmHg
FEF 25-75 Post: 2.93 L/s
FEF 25-75 Pre: 2.67 L/s
FEF2575-%Change-Post: 9 %
FEF2575-%Pred-Post: 100 %
FEF2575-%Pred-Pre: 91 %
FEV1-%Change-Post: 3 %
FEV1-%Pred-Post: 84 %
FEV1-%Pred-Pre: 81 %
FEV1-Post: 3.34 L
FEV1-Pre: 3.22 L
FEV1FVC-%Change-Post: -1 %
FEV1FVC-%Pred-Pre: 105 %
FEV6-%Change-Post: 5 %
FEV6-%Pred-Post: 86 %
FEV6-%Pred-Pre: 82 %
FEV6-Post: 4.39 L
FEV6-Pre: 4.17 L
FEV6FVC-%Change-Post: 0 %
FEV6FVC-%Pred-Post: 105 %
FEV6FVC-%Pred-Pre: 104 %
FVC-%Change-Post: 5 %
FVC-%Pred-Post: 82 %
FVC-%Pred-Pre: 78 %
FVC-Post: 4.41 L
FVC-Pre: 4.2 L
Post FEV1/FVC ratio: 76 %
Post FEV6/FVC ratio: 100 %
Pre FEV1/FVC ratio: 77 %
Pre FEV6/FVC Ratio: 99 %
RV % pred: 115 %
RV: 3.27 L
TLC % pred: 91 %
TLC: 7.56 L

## 2024-07-09 MED ORDER — OLOPATADINE HCL 0.6 % NA SOLN: 2.0000 | Freq: Two times a day (BID) | NASAL | 2 refills | Status: DC
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
## 2024-07-09 NOTE — Progress Notes (Signed)
 Full PFT completed today ? ?

## 2024-07-09 NOTE — Patient Instructions (Signed)
 Full PFT completed today ? ?

## 2024-07-09 NOTE — Patient Instructions (Signed)
 VISIT SUMMARY:  Today, you were seen for shortness of breath, sleep disturbances, and postnasal drip. We discussed your recent cardiopulmonary stress test results, weight loss efforts, and upcoming bladder surgery. We also reviewed your sleep issues and nasal symptoms.  YOUR PLAN:  -OBESITY-RELATED DYSPNEA: Your shortness of breath is primarily due to obesity. The recent cardiopulmonary stress test showed low normal functional capacity, which means your heart and lungs are working at a lower level than expected. Continue your weight loss efforts and stay active as much as you can.  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 Previous attempts to use a CPAP machine were not successful. Consider using a weighted blanket to help improve your sleep quality.  -POSTNASAL DRIP: You have intermittent postnasal drip, which is causing wheezing when you exhale. This condition is when mucus drips down the back of your throat. Continue using the prescribed nasal spray to reduce inflammation and manage your symptoms.  INSTRUCTIONS:  Continue with your weight loss efforts and physical activity as tolerated. Consider using a weighted blanket to improve your sleep quality. Use the prescribed nasal spray (Patanase) to manage your postnasal drip. Follow up with your surgeon as scheduled for your bladder surgery on November 5th.  Will see you in follow-up in 6 months time call sooner should any problems arise.

## 2024-07-11 NOTE — Progress Notes (Signed)
 COVID Vaccine received:  []  No [x]  Yes Date of any COVID positive Test in last 90 days:  none  PCP - Lynwood Null, MD at [facility] Cardiologist - Cara Lovelace, MD  Neurology - Arthea Farrow, MD at [facility] Pulmonology- Dedra Sanders, MD   Chest x-ray - 07-02-2023  2v (CEW), CT chest 10-26-23 Epic  EKG -  07-13-24  Epic Stress Test - 06-05-24  Epic ECHO -  Cardiac Cath - 12-13-2021  [facility] by Dr. Lovelace CT Coronary Calcium  score: 149 on 10-28-23  Epic Pulm. Function Test- 07-09-2024  Epic  Bowel Prep - []  No  [x]   Yes Mag. Citrate _  Pacemaker / ICD device [x]  No []  Yes   Spinal Cord Stimulator:[x]  No []  Yes       History of Sleep Apnea? []  No [x]  Yes   CPAP used?- [x]  No []  Yes    Patient has: []  NO Hx DM   [x]  Pre-DM   []  DM1  []   DM2 Does the patient monitor blood sugar?   []  N/A   [x]  No []  Yes  Last A1c was:  6.4  on   04-14-24   Glipizide-  Hold DOS   DAPT Blood Thinner / Instructions:  PLAVIX     Hold 5 days per patient Aspirin  Instructions:  ASA 81 mg  hold x 5 days per Patient instruction from Dr. Lovelace.   Dental hx: []  Dentures:  [x]  N/A      []  Bridge or Partial:                   []  Loose or Damaged teeth:   Comments: Patient's chart was originally marked as Blood Refusal and Shameeka scheduled his PST for today (16 days prior to surgery). I questioned the patient and he said that was inaccurate, He does want a blood transfusion to save his life. In the past, His wife misunderstood him and mistakenly had Blood refusal put in EPIC.    Because of this, the patient will now be need to have his T&S drawn the DOS   Activity level: Able to walk up 2 flights of stairs without becoming significantly short of breath or having chest pain?  []  No   [x]    Yes  a little SOB Patient can perform ADLs without assistance. []  No   [x]   Yes  Anesthesia review: HTN, Pre-DM, s/p Left carotid PTA/Stent, PAD, DOE- reactive airway disease, Hx of SI/ aggression, PONV    Patient denies any S&S of respiratory illness or Covid - no shortness of breath, fever, cough or chest pain at PAT appointment.  Patient verbalized understanding and agreement to the Pre-Surgical Instructions that were given to them at this PAT appointment. Patient was also educated of the need to review these PAT instructions again prior to his surgery.I reviewed the appropriate phone numbers to call if they have any and questions or concerns.

## 2024-07-11 NOTE — Patient Instructions (Addendum)
 SURGICAL WAITING ROOM VISITATION Patients having surgery or a procedure may have no more than 2 support people in the waiting area - these visitors may rotate in the visitor waiting room.   If the patient needs to stay at the hospital during part of their recovery, the visitor guidelines for inpatient rooms apply.  PRE-OP VISITATION  Pre-op nurse will coordinate an appropriate time for 1 support person to accompany the patient in pre-op.  This support person may not rotate.  This visitor will be contacted when the time is appropriate for the visitor to come back in the pre-op area.  Please refer to the [facility] website for the visitor guidelines for Inpatients (after your surgery is over and you are in a regular room).  You are not required to quarantine at this time prior to your surgery. However, you must do this: Hand Hygiene often Do NOT share personal items Notify your provider if you are in close contact with someone who has COVID or you develop fever 100.4 or greater, new onset of sneezing, cough, sore throat, shortness of breath or body aches.  If you test positive for Covid or have been in contact with anyone that has tested positive in the last 10 days please notify you surgeon.    Your procedure is scheduled on:  Wednesday  July 29, 2024  Report to [facility] Main Entrance: [address] entrance where the [employer] is available.   Report to admitting at: 06:15 AM  Call this number if you have any questions or problems the morning of surgery [phone]  DO NOT EAT OR DRINK ANYTHING AFTER MIDNIGHT THE NIGHT PRIOR TO YOUR SURGERY / PROCEDURE.   FOLLOW  ANY ADDITIONAL PRE OP INSTRUCTIONS YOU RECEIVED FROM YOUR SURGEON'S OFFICE!!!  MAGNESIUM  CITRATE:  Obtain one (1)  bottle (8 oz) of Magnesium  Citrate at your pharmacy. Drink entire bottle at 12:00 noon the day before your surgery/ procedure.     Oral Hygiene is also important to reduce your risk of infection.         Remember - BRUSH YOUR TEETH THE MORNING OF SURGERY WITH YOUR REGULAR TOOTHPASTE  Do NOT smoke after Midnight the night before surgery.  STOP TAKING all Vitamins, Herbs and supplements 1 week before your surgery.   PLAVIX - stop taking ???? ASPIRIN - Stop taking ????  GLIPIZIDE- Day BEFORE surgery, take as usual in the morning or at lunch.                       DAY OF SURGERY: DO NOT TAKE GLIPIZIDE.   Take ONLY these medicines the morning of surgery with A SIP OF WATER : Metoprolol , Isosorbide , Lamotrigine (Lamictal) and you may use your Nasal spray.                      You may not have any metal on your body including  jewelry, and body piercing  Do not wear  lotions, powders, cologne, or deodorant  Men may shave face and neck.  Contacts, Hearing Aids, dentures or bridgework may not be worn into surgery. DENTURES WILL BE REMOVED PRIOR TO SURGERY PLEASE DO NOT APPLY Poly grip OR ADHESIVES!!!  You may bring a small overnight bag with you on the day of surgery, only pack items that are not valuable. [facility] IS NOT RESPONSIBLE   FOR VALUABLES THAT ARE LOST OR STOLEN.   Do not bring your home medications to the hospital. The  Pharmacy will dispense medications listed on your medication list to you during your admission in the Hospital.  Please read over the following fact sheets you were given: IF YOU HAVE QUESTIONS ABOUT YOUR PRE-OP INSTRUCTIONS, PLEASE CALL [phone].   [facility] - Preparing for Surgery      Before surgery, you can play an important role.  Because skin is not sterile, your skin needs to be as free of germs as possible.  You can reduce the number of germs on your skin by washing with CHG (chlorahexidine gluconate) soap before surgery.  CHG is an antiseptic cleaner which kills germs and bonds with the skin to continue killing germs even after washing. Please DO NOT use if you have an allergy to CHG or antibacterial soaps.  If your skin becomes  reddened/irritated stop using the CHG and inform your nurse when you arrive at Short Stay. Do not shave (including legs and underarms) for at least 48 hours prior to the first CHG shower.  You may shave your face/neck.   Please follow these instructions carefully:  1.  Shower with CHG Soap the night before surgery ONLY (DO NOT USE THE CHG SOAP THE MORNING OF SURGERY).  2.  If you choose to wash your hair, wash your hair first as usual with your normal  shampoo.  3.  After you shampoo, rinse your hair and body thoroughly to remove the shampoo.                             4.  Use CHG as you would any other liquid soap.  You can apply chg directly to the skin and wash.  Gently with a scrungie or clean washcloth.  5.  Apply the CHG Soap to your body ONLY FROM THE NECK DOWN.   Do not use on face/ open                           Wound or open sores. Avoid contact with eyes, ears mouth and genitals (private parts).                       Wash face,  Genitals (private parts) with your normal soap.             6.  Wash thoroughly, paying special attention to the area where your  surgery  will be performed.  7.  Thoroughly rinse your body with warm water  from the neck down.  8.  DO NOT shower/wash with your normal soap after using and rinsing off the CHG Soap.                9.  Pat yourself dry with a clean towel.            10.  Wear clean pajamas.            11.  Place clean sheets on your bed the night of your first shower and do not  sleep with pets.  Day of Surgery : Do not apply any CHG, lotions/deodorants the morning of surgery.  Please wear clean clothes to the hospital/surgery center.   FAILURE TO FOLLOW THESE INSTRUCTIONS MAY RESULT IN THE CANCELLATION OF YOUR SURGERY  PATIENT SIGNATURE_________________________________  NURSE SIGNATURE__________________________________  ________________________________________________________________________

## 2024-07-13 ENCOUNTER — Encounter (HOSPITAL_COMMUNITY)
Admission: RE | Admit: 2024-07-13 | Discharge: 2024-07-13 | Disposition: A | Discharge status: Home / Self Care | Source: Ambulatory Visit | Attending: Urology | Admitting: Urology

## 2024-07-13 ENCOUNTER — Other Ambulatory Visit: Payer: Self-pay

## 2024-07-13 ENCOUNTER — Encounter (HOSPITAL_COMMUNITY): Payer: Self-pay

## 2024-07-13 VITALS — BP 122/60 | HR 52 | Temp 97.8°F | Resp 16 | Ht 76.0 in | Wt 243.0 lb

## 2024-07-13 DIAGNOSIS — I10 Essential (primary) hypertension: Secondary | ICD-10-CM | POA: Insufficient documentation

## 2024-07-13 DIAGNOSIS — R7303 Prediabetes: Secondary | ICD-10-CM | POA: Insufficient documentation

## 2024-07-13 DIAGNOSIS — R9431 Abnormal electrocardiogram [ECG] [EKG]: Secondary | ICD-10-CM | POA: Insufficient documentation

## 2024-07-13 DIAGNOSIS — Z01812 Encounter for preprocedural laboratory examination: Secondary | ICD-10-CM | POA: Diagnosis present

## 2024-07-13 DIAGNOSIS — Z01810 Encounter for preprocedural cardiovascular examination: Secondary | ICD-10-CM | POA: Diagnosis present

## 2024-07-13 DIAGNOSIS — Z01818 Encounter for other preprocedural examination: Secondary | ICD-10-CM | POA: Diagnosis not present

## 2024-07-13 HISTORY — DX: Myoneural disorder, unspecified: G70.9

## 2024-07-13 LAB — BASIC METABOLIC PANEL WITH GFR
Anion gap: 9 (ref 5–15)
BUN: 17 mg/dL (ref 8–23)
CO2: 25 mmol/L (ref 22–32)
Calcium: 9.4 mg/dL (ref 8.9–10.3)
Chloride: 104 mmol/L (ref 98–111)
Creatinine, Ser: 1.01 mg/dL (ref 0.61–1.24)
GFR, Estimated: >60 mL/min (ref >60)
Glucose, Bld: 157 mg/dL — ABNORMAL HIGH (ref 70–99)
Potassium: 4.4 mmol/L (ref 3.5–5.1)
Sodium: 139 mmol/L (ref 135–145)

## 2024-07-13 LAB — CBC
HCT: 48 % (ref 39.0–52.0)
Hemoglobin: 15.4 g/dL (ref 13.0–17.0)
MCH: 28.4 pg (ref 26.0–34.0)
MCHC: 32.1 g/dL (ref 30.0–36.0)
MCV: 88.6 fL (ref 80.0–100.0)
Platelets: 197 K/uL (ref 150–400)
RBC: 5.42 MIL/uL (ref 4.22–5.81)
RDW: 12.6 % (ref 11.5–15.5)
WBC: 4.4 K/uL (ref 4.0–10.5)
nRBC: 0 % (ref 0.0–0.2)

## 2024-07-15 ENCOUNTER — Encounter: Payer: Self-pay | Admitting: Pulmonary Disease

## 2024-07-21 ENCOUNTER — Ambulatory Visit: Payer: Self-pay | Admitting: Urology

## 2024-07-21 NOTE — Anesthesia Preprocedure Evaluation (Addendum)
 Anesthesia Evaluation  Patient identified by MRN, date of birth, ID band Patient awake    Reviewed: Allergy & Precautions, NPO status , Patient's Chart, lab work & pertinent test results, reviewed documented beta blocker date and time   History of Anesthesia Complications (+) PONV and history of anesthetic complications (PONV attributed to thiopental? had sevo in 2023 for prostate surgery at [facility])  Airway Mallampati: III  TM Distance: >3 FB Neck ROM: Full    Dental  (+) Teeth Intact, Dental Advisory Given   Pulmonary shortness of breath and with exertion, sleep apnea (unable to tolerate CPAP)   seen by pulmonology on 07/09/2024 for chronic shortness of breath.  PFTs done were normal with the exception of a low ERV related to truncal obesity.  Spirometric values lung volumes otherwise and diffusion capacity were normal.  It was felt his shortness of breath was related to obesity and advised to lose weight.   Pulmonary exam normal breath sounds clear to auscultation       Cardiovascular hypertension (148/85 preop), Pt. on medications and Pt. on home beta blockers + Peripheral Vascular Disease and + DOE  Normal cardiovascular exam+ Valvular Problems/Murmurs (mild AI, mild MR) AI and MR  Rhythm:Regular Rate:Normal  [facility] on [facility].  Echo obtained due to reports of shortness of breath which showed normal LVEF with mild MR and mild AI. Cardiac CTA recommended as well due to reports of chest pain, done on 10/28/23 which showed mild non-obstructive CAD, stable from cath in 11/2021.  Stress test 2025: Low normal functional capacity. The majority of patient's limitation appears to be related to his obesity. The mildly elevated VE/VCO2 slope suggests possible elevated pulmonary pressures during exercise.  Coronary CTA 10/28/2023:     IMPRESSION: 1. Coronary calcium  score of 149. This was 46th percentile for age and sex matched  control.   2. Normal coronary origin with right dominance.   3. Mild proximal LAD stenosis (25-49%)   4. CAD-RADS 2. Mild non-obstructive CAD (25-49%). Consider non-atherosclerotic causes of chest pain. Consider preventive therapy and risk factor modification.   Echo 10/08/2023 ([facility]):   CONCLUSION ------------------------------------------------------------------------------- NORMAL LEFT VENTRICULAR SYSTOLIC FUNCTION WITH MILD LVH ESTIMATED EF: >55% NORMAL LA PRESSURES WITH DIASTOLIC DYSFUNCTION (GRADE 1) NORMAL RIGHT VENTRICULAR SYSTOLIC FUNCTION VALVULAR REGURGITATION: MILD AR, MILD MR, No PR, TRIVIAL TR NO VALVULAR STENOSIS PHYSICIAN IMPRESSIONS -------------------------------------------------------------------- LA AREA 18cm^2    Neuro/Psych  PSYCHIATRIC DISORDERS  Depression    L ICA stent 2023- plavix   Seen by Neurology on 06/01/24 at [facility] for chronic dizziness/lightheadedness, imbalance    GI/Hepatic negative GI ROS, Neg liver ROS,,,  Endo/Other  diabetes, Well Controlled, Type 2, Oral Hypoglycemic Agents    Renal/GU negative Renal ROS Bladder dysfunction      Musculoskeletal  (+) Arthritis , Osteoarthritis,    Abdominal  (+) + obese  Peds  Hematology negative hematology ROS (+) Hb 15.4, plpt 197   Anesthesia Other Findings   Reproductive/Obstetrics negative OB ROS                              Anesthesia Physical Anesthesia Plan  ASA: 3  Anesthesia Plan: General   Post-op Pain Management: Tylenol  PO (pre-op)*, Ketamine IV* and Dilaudid  IV   Induction: Intravenous  PONV Risk Score and Plan: 4 or greater and Ondansetron , Dexamethasone , Midazolam  and Treatment may vary due to age or medical condition  Airway Management Planned: Oral ETT and Video  Laryngoscope Planned  Additional Equipment: None  Intra-op Plan:   Post-operative Plan: Extubation in OR  Informed Consent: I have reviewed the patients History and Physical,  chart, labs and discussed the procedure including the risks, benefits and alternatives for the proposed anesthesia with the patient or authorized representative who has indicated his/her understanding and acceptance.     Dental advisory given  Plan Discussed with: CRNA  Anesthesia Plan Comments: (Last airway note (2023): required glide Ventilation: Mask ventilation without difficulty Laryngoscope Size: Mac, 3 and McGraph Grade View: Grade II Tube type: Oral Tube size: 7.5 mm Number of attempts: 1 )         Anesthesia Quick Evaluation

## 2024-07-21 NOTE — Progress Notes (Signed)
 Case: [id number] Date/Time: 07/29/24 0815   Procedures:      CYSTECTOMY, PARTIAL, ROBOT-ASSISTED, LAPAROSCOPIC     CYSTOSCOPY WITH INDOCYANINE GREEN IMAGING (ICG)     CYSTOSCOPY, WITH RETROGRADE PYELOGRAM   Anesthesia type: General   Diagnosis:      Feeling of incomplete bladder emptying [R39.14]     Urgency of urination [R39.15]   Pre-op diagnosis: BLADDER   Location: [facility] ROOM 03 / [facility]   Surgeons: Alvaro Ricardo KATHEE Mickey., MD       DISCUSSION: Randy Shaffer is a 73 yo male with PMH of hypertension, CAD, DOE, carotid artery disease, OSA (no CPAP use), peripheral neuropathy.  Prior anesthesia complications include PONV, difficult airway - Prior airway note in 07/2022 indicates Mac 3 and McGrath with Grade II view.  Patient seen by pulmonology on 07/09/2024 for chronic shortness of breath.  PFTs done were normal with the exception of a low ERV related to truncal obesity.  Spirometric values lung volumes otherwise and diffusion capacity were normal.  It was felt his shortness of breath was related to obesity and advised to lose weight.  Patient evaluated by cardiology in the past.  Last seen on 10/03/2023 at [facility]  Echo obtained due to reports of shortness of breath which showed normal LVEF with mild MR and mild AI. Cardiac CTA recommended as well due to reports of chest pain, done on 10/28/23 which showed mild non-obstructive CAD, stable from cath in 11/2021.  Patient with history of carotid artery disease.  He is status post left ICA stent in 2023 with vascular.  He is on Plavix .  Seen by Neurology on 06/01/24 at [facility] for chronic dizziness/lightheadedness, imbalance. Advised continue current meds and f/u with Cardiology, Pulmonology, Urology.  LD Plavix : 10/31   VS: BP 122/60 Comment: right arm sitting  Pulse (!) 52   Temp 36.6 C (Oral)   Resp 16   Ht 6' 4 (1.93 m)   Wt 110.2 kg   SpO2 100%   BMI 29.58 kg/m   PROVIDERS: Valora Lynwood FALCON, MD Cardiologist - Cara Lovelace, MD   Neurology - Arthea Farrow, MD  Pulmonology- Dedra Sanders, MD   LABS: Labs reviewed: Acceptable for surgery. (all labs ordered are listed, but only abnormal results are displayed)  Labs Reviewed  BASIC METABOLIC PANEL WITH GFR - Abnormal; Notable for the following components:      Result Value   Glucose, Bld 157 (*)    All other components within normal limits  CBC    EKG 07/13/2024:  Sinus bradycardia Cannot rule out Anterior infarct , age undetermined Abnormal ECG No previous ECGs available No previous tracing  Coronary CTA 10/28/2023:   IMPRESSION: 1. Coronary calcium  score of 149. This was 46th percentile for age and sex matched control.   2. Normal coronary origin with right dominance.   3. Mild proximal LAD stenosis (25-49%)   4. CAD-RADS 2. Mild non-obstructive CAD (25-49%). Consider non-atherosclerotic causes of chest pain. Consider preventive therapy and risk factor modification.  Echo 10/08/2023 ([facility]):  CONCLUSION ------------------------------------------------------------------------------- NORMAL LEFT VENTRICULAR SYSTOLIC FUNCTION WITH MILD LVH ESTIMATED EF: >55% NORMAL LA PRESSURES WITH DIASTOLIC DYSFUNCTION (GRADE 1) NORMAL RIGHT VENTRICULAR SYSTOLIC FUNCTION VALVULAR REGURGITATION: MILD AR, MILD MR, No PR, TRIVIAL TR NO VALVULAR STENOSIS PHYSICIAN IMPRESSIONS -------------------------------------------------------------------- LA AREA 18cm^2  Past Medical History:  Diagnosis Date   Anginal pain    Aortic atherosclerosis    BPH (benign prostatic hyperplasia)    CAD (coronary artery disease) 12/13/2021   a.)  R/LHC 12/13/2021: EF 55-65%, 25% dLM-oLAD, 25% pLAD, 50% m-dLAD; mPA 20, mPCWP 10, Ao sat 96, PA sat 71, CO 5.7.   Cardiac murmur    Carotid artery disease    a.) CTA neck 03/02/2022: 70% LICA; b.) PTA 04/19/2022: 80% LICA --> POBA performed  followed by placement of a 9 mm prox, 7mm distal, 4 cm long exact stent placed; 10-15% residual  stenosis; c.) carotid doppler 05/22/2022: 1-39% BICA   Complication of anesthesia    a.) PONV   Depression    Diet-controlled type 2 diabetes mellitus (HCC)    Diverticulosis 09/05/2011   DOE (dyspnea on exertion)    History of bilateral cataract extraction    HLD (hyperlipidemia)    Hypertension    Insomnia    a.) takes trazodone  PRN   Long term current use of antithrombotics/antiplatelets    a.) on daily DAPT (ASA + clopidogrel )   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    Neuromuscular disorder (HCC)    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   OA (osteoarthritis) of knee    PONV (postoperative nausea and vomiting)    SEVERE   Sleep apnea    a.) unable to tolerate nocturnal PAP therapy    Past Surgical History:  Procedure Laterality Date   AMPUTATION FINGER / THUMB Right    THUMB PLUS 3 RECONSTRUCTIVE SURGERIES   CAROTID PTA/STENT INTERVENTION Left 04/16/2022   Procedure: CAROTID PTA/STENT INTERVENTION;  Surgeon: Marea Selinda RAMAN, MD;  Location: [facility];  Service: Cardiovascular;  Laterality: Left;   COLONOSCOPY WITH PROPOFOL  N/A 02/01/2023   Procedure: COLONOSCOPY WITH PROPOFOL ;  Surgeon: Maryruth Ole DASEN, MD;  Location: [facility];  Service: Endoscopy;  Laterality: N/A;   CORONARY ANGIOPLASTY     HEAD LACERATION REPAIR     HERNIA REPAIR  09/24/1998   ONE AS INFANT   HIP NERVE RELEASE     HOLEP-LASER ENUCLEATION OF THE PROSTATE WITH MORCELLATION N/A 08/03/2022   Procedure: HOLEP-LASER ENUCLEATION OF THE PROSTATE WITH MORCELLATION;  Surgeon: Francisca Redell BROCKS, MD;  Location: [facility];  Service: Urology;  Laterality: N/A;   KNEE ARTHROSCOPY Right 09/24/2001   KNEE ARTHROSCOPY Right 02/23/2015   Procedure: ARTHROSCOPY KNEE WITH DEBRIDEMENT PARTIAL MEDIAL MENISECTOMY;  Surgeon: Norleen JINNY Maltos, MD;  Location: [facility];  Service: Orthopedics;  Laterality: Right;   PHOTOREFRACTIVE KERATOTOMY     RIGHT/LEFT HEART CATH AND CORONARY ANGIOGRAPHY N/A 12/13/2021   Procedure: RIGHT/LEFT HEART  CATH AND CORONARY ANGIOGRAPHY;  Surgeon: Florencio Cara BIRCH, MD;  Location: [facility];  Service: Cardiovascular;  Laterality: N/A;    MEDICATIONS:  aspirin  EC 81 MG tablet   atorvastatin  (LIPITOR) 40 MG tablet   clopidogrel  (PLAVIX ) 75 MG tablet   glipiZIDE (GLUCOTROL XL) 5 MG 24 hr tablet   isosorbide  mononitrate (IMDUR ) 30 MG 24 hr tablet   lamoTRIgine (LAMICTAL) 100 MG tablet   metoprolol  succinate (TOPROL -XL) 25 MG 24 hr tablet   Olopatadine HCl 0.6 % SOLN   traZODone  (DESYREL ) 50 MG tablet   No current facility-administered medications for this encounter.   Burnard CHRISTELLA Odis DEVONNA [facility] Short Stay/Anesthesiology [facility] Phone [phone] 07/21/2024 2:50 PM

## 2024-07-29 ENCOUNTER — Inpatient Hospital Stay (HOSPITAL_COMMUNITY)

## 2024-07-29 ENCOUNTER — Encounter (HOSPITAL_COMMUNITY): Payer: Self-pay | Admitting: Urology

## 2024-07-29 ENCOUNTER — Other Ambulatory Visit: Payer: Self-pay

## 2024-07-29 ENCOUNTER — Encounter (HOSPITAL_COMMUNITY): Admission: RE | Disposition: A | Payer: Self-pay | Source: Ambulatory Visit | Attending: Urology

## 2024-07-29 ENCOUNTER — Inpatient Hospital Stay (HOSPITAL_COMMUNITY): Payer: Self-pay | Admitting: Anesthesiology

## 2024-07-29 ENCOUNTER — Inpatient Hospital Stay (HOSPITAL_COMMUNITY)
Admission: RE | Admit: 2024-07-29 | Discharge: 2024-07-30 | DRG: 670 | Disposition: A | Discharge status: Home / Self Care | Source: Ambulatory Visit | Attending: Urology | Admitting: Urology

## 2024-07-29 ENCOUNTER — Inpatient Hospital Stay (HOSPITAL_COMMUNITY): Payer: Self-pay | Admitting: Physician Assistant

## 2024-07-29 DIAGNOSIS — Z7984 Long term (current) use of oral hypoglycemic drugs: Secondary | ICD-10-CM

## 2024-07-29 DIAGNOSIS — E119 Type 2 diabetes mellitus without complications: Secondary | ICD-10-CM | POA: Diagnosis not present

## 2024-07-29 DIAGNOSIS — G4700 Insomnia, unspecified: Secondary | ICD-10-CM | POA: Diagnosis present

## 2024-07-29 DIAGNOSIS — G4733 Obstructive sleep apnea (adult) (pediatric): Secondary | ICD-10-CM | POA: Diagnosis present

## 2024-07-29 DIAGNOSIS — N400 Enlarged prostate without lower urinary tract symptoms: Secondary | ICD-10-CM | POA: Diagnosis present

## 2024-07-29 DIAGNOSIS — R3914 Feeling of incomplete bladder emptying: Secondary | ICD-10-CM | POA: Diagnosis present

## 2024-07-29 DIAGNOSIS — E1141 Type 2 diabetes mellitus with diabetic mononeuropathy: Secondary | ICD-10-CM | POA: Diagnosis present

## 2024-07-29 DIAGNOSIS — I10 Essential (primary) hypertension: Secondary | ICD-10-CM

## 2024-07-29 DIAGNOSIS — Z7902 Long term (current) use of antithrombotics/antiplatelets: Secondary | ICD-10-CM | POA: Diagnosis not present

## 2024-07-29 DIAGNOSIS — H9191 Unspecified hearing loss, right ear: Secondary | ICD-10-CM | POA: Diagnosis present

## 2024-07-29 DIAGNOSIS — I2510 Atherosclerotic heart disease of native coronary artery without angina pectoris: Secondary | ICD-10-CM | POA: Diagnosis present

## 2024-07-29 DIAGNOSIS — N323 Diverticulum of bladder: Secondary | ICD-10-CM | POA: Diagnosis present

## 2024-07-29 DIAGNOSIS — G5793 Unspecified mononeuropathy of bilateral lower limbs: Secondary | ICD-10-CM | POA: Diagnosis present

## 2024-07-29 DIAGNOSIS — G8929 Other chronic pain: Secondary | ICD-10-CM | POA: Diagnosis present

## 2024-07-29 DIAGNOSIS — F32A Depression, unspecified: Secondary | ICD-10-CM | POA: Diagnosis present

## 2024-07-29 DIAGNOSIS — K4090 Unilateral inguinal hernia, without obstruction or gangrene, not specified as recurrent: Secondary | ICD-10-CM | POA: Diagnosis present

## 2024-07-29 DIAGNOSIS — Z89011 Acquired absence of right thumb: Secondary | ICD-10-CM | POA: Diagnosis not present

## 2024-07-29 DIAGNOSIS — Z888 Allergy status to other drugs, medicaments and biological substances status: Secondary | ICD-10-CM

## 2024-07-29 DIAGNOSIS — E785 Hyperlipidemia, unspecified: Secondary | ICD-10-CM | POA: Diagnosis present

## 2024-07-29 DIAGNOSIS — Z8249 Family history of ischemic heart disease and other diseases of the circulatory system: Secondary | ICD-10-CM

## 2024-07-29 DIAGNOSIS — I700 Atherosclerosis of aorta: Secondary | ICD-10-CM | POA: Diagnosis present

## 2024-07-29 HISTORY — PX: CYSTOSCOPY W/ RETROGRADES: SHX1426

## 2024-07-29 HISTORY — PX: CYSTOSCOPY WITH INDOCYANINE GREEN IMAGING (ICG): SHX7549

## 2024-07-29 LAB — GLUCOSE, CAPILLARY
Glucose-Capillary: 233 mg/dL — ABNORMAL HIGH (ref 70–99)
Glucose-Capillary: 180 mg/dL — ABNORMAL HIGH (ref 70–99)

## 2024-07-29 LAB — HEMOGLOBIN AND HEMATOCRIT, BLOOD
HCT: 52.2 % — ABNORMAL HIGH (ref 39.0–52.0)
Hemoglobin: 16.5 g/dL (ref 13.0–17.0)

## 2024-07-29 LAB — HEMOGLOBIN A1C
Hgb A1c MFr Bld: 6.5 % — ABNORMAL HIGH (ref 4.8–5.6)
Mean Plasma Glucose: 139.85 mg/dL

## 2024-07-29 SURGERY — CYSTECTOMY, PARTIAL, ROBOT-ASSISTED, LAPAROSCOPIC
Anesthesia: General

## 2024-07-29 MED ORDER — SODIUM CHLORIDE 0.9 % IV SOLN: INTRAVENOUS | Status: DC

## 2024-07-29 MED ORDER — PROPOFOL 10 MG/ML IV BOLUS
INTRAVENOUS | Status: DC | PRN
  Administered 2024-07-29: 150 mg via INTRAVENOUS

## 2024-07-29 MED ORDER — BUPIVACAINE LIPOSOME 1.3 % IJ SUSP
INTRAMUSCULAR | Status: DC | PRN
  Administered 2024-07-29: 20 mL

## 2024-07-29 MED ORDER — ACETAMINOPHEN 500 MG PO TABS
1000.0000 mg | ORAL_TABLET | Freq: Once | ORAL | Status: AC
  Administered 2024-07-29: 1000 mg via ORAL
  Filled 2024-07-29: qty 2

## 2024-07-29 MED ORDER — KETAMINE HCL 50 MG/5ML IJ SOSY
PREFILLED_SYRINGE | INTRAMUSCULAR | Status: AC
  Filled 2024-07-29: qty 5

## 2024-07-29 MED ORDER — MAGNESIUM CITRATE PO SOLN
1.0000 | Freq: Once | ORAL | Status: DC
  Filled 2024-07-29: qty 296

## 2024-07-29 MED ORDER — METOPROLOL SUCCINATE ER 25 MG PO TB24
12.5000 mg | ORAL_TABLET | Freq: Two times a day (BID) | ORAL | Status: DC
  Administered 2024-07-29 – 2024-07-30 (×2): 12.5 mg via ORAL
  Filled 2024-07-29 (×2): qty 1

## 2024-07-29 MED ORDER — SODIUM CHLORIDE (PF) 0.9 % IJ SOLN
INTRAMUSCULAR | Status: AC
  Filled 2024-07-29: qty 20

## 2024-07-29 MED ORDER — ATORVASTATIN CALCIUM 20 MG PO TABS
20.0000 mg | ORAL_TABLET | Freq: Every day | ORAL | Status: DC
  Administered 2024-07-30: 20 mg via ORAL
  Filled 2024-07-29: qty 1

## 2024-07-29 MED ORDER — ONDANSETRON HCL 4 MG/2ML IJ SOLN
INTRAMUSCULAR | Status: DC | PRN
  Administered 2024-07-29: 4 mg via INTRAVENOUS

## 2024-07-29 MED ORDER — INSULIN ASPART 100 UNIT/ML IJ SOLN
0.0000 [IU] | Freq: Three times a day (TID) | INTRAMUSCULAR | Status: DC
  Administered 2024-07-30: 2 [IU] via SUBCUTANEOUS
  Administered 2024-07-30: 3 [IU] via SUBCUTANEOUS
  Filled 2024-07-29: qty 3
  Filled 2024-07-29: qty 2

## 2024-07-29 MED ORDER — DEXAMETHASONE SOD PHOSPHATE PF 10 MG/ML IJ SOLN
INTRAMUSCULAR | Status: DC | PRN
  Administered 2024-07-29: 4 mg via INTRAVENOUS

## 2024-07-29 MED ORDER — FENTANYL CITRATE (PF) 100 MCG/2ML IJ SOLN
INTRAMUSCULAR | Status: DC | PRN
  Administered 2024-07-29 (×2): 50 ug via INTRAVENOUS

## 2024-07-29 MED ORDER — SODIUM CHLORIDE FLUSH 0.9 % IV SOLN
INTRAVENOUS | Status: DC | PRN
  Administered 2024-07-29: 20 mL

## 2024-07-29 MED ORDER — KETAMINE HCL 10 MG/ML IJ SOLN
INTRAMUSCULAR | Status: DC | PRN
  Administered 2024-07-29: 30 mg via INTRAVENOUS
  Administered 2024-07-29: 10 mg via INTRAVENOUS

## 2024-07-29 MED ORDER — ONDANSETRON HCL 4 MG/2ML IJ SOLN: 4.0000 mg | INTRAMUSCULAR | Status: DC | PRN

## 2024-07-29 MED ORDER — HYOSCYAMINE SULFATE 0.125 MG SL SUBL: 0.1250 mg | SUBLINGUAL_TABLET | SUBLINGUAL | Status: DC | PRN

## 2024-07-29 MED ORDER — ORAL CARE MOUTH RINSE: 15.0000 mL | Freq: Once | OROMUCOSAL | Status: AC

## 2024-07-29 MED ORDER — ROCURONIUM BROMIDE 10 MG/ML (PF) SYRINGE
PREFILLED_SYRINGE | INTRAVENOUS | Status: AC
  Filled 2024-07-29: qty 10

## 2024-07-29 MED ORDER — LAMOTRIGINE 25 MG PO TABS
50.0000 mg | ORAL_TABLET | Freq: Two times a day (BID) | ORAL | Status: DC
  Administered 2024-07-30: 50 mg via ORAL
  Filled 2024-07-29: qty 2

## 2024-07-29 MED ORDER — FENTANYL CITRATE (PF) 100 MCG/2ML IJ SOLN
INTRAMUSCULAR | Status: AC
  Filled 2024-07-29: qty 2

## 2024-07-29 MED ORDER — ROCURONIUM BROMIDE 100 MG/10ML IV SOLN
INTRAVENOUS | Status: DC | PRN
  Administered 2024-07-29: 15 mg via INTRAVENOUS
  Administered 2024-07-29: 100 mg via INTRAVENOUS

## 2024-07-29 MED ORDER — SUGAMMADEX SODIUM 200 MG/2ML IV SOLN
INTRAVENOUS | Status: DC | PRN
  Administered 2024-07-29: 200 mg via INTRAVENOUS

## 2024-07-29 MED ORDER — TRIPLE ANTIBIOTIC 3.5-400-5000 EX OINT: 1.0000 | TOPICAL_OINTMENT | Freq: Three times a day (TID) | CUTANEOUS | Status: DC | PRN

## 2024-07-29 MED ORDER — OXYCODONE HCL 5 MG PO TABS: 5.0000 mg | ORAL_TABLET | Freq: Once | ORAL | Status: DC | PRN

## 2024-07-29 MED ORDER — SUGAMMADEX SODIUM 200 MG/2ML IV SOLN
INTRAVENOUS | Status: AC
  Filled 2024-07-29: qty 2

## 2024-07-29 MED ORDER — HYDROMORPHONE HCL 1 MG/ML IJ SOLN
INTRAMUSCULAR | Status: AC
  Filled 2024-07-29: qty 1

## 2024-07-29 MED ORDER — CHLORHEXIDINE GLUCONATE 0.12 % MT SOLN
15.0000 mL | Freq: Once | OROMUCOSAL | Status: AC
  Administered 2024-07-29: 15 mL via OROMUCOSAL

## 2024-07-29 MED ORDER — LACTATED RINGERS IV SOLN: INTRAVENOUS | Status: DC

## 2024-07-29 MED ORDER — DIPHENHYDRAMINE HCL 50 MG/ML IJ SOLN: 12.5000 mg | Freq: Four times a day (QID) | INTRAMUSCULAR | Status: DC | PRN

## 2024-07-29 MED ORDER — SULFAMETHOXAZOLE-TRIMETHOPRIM 800-160 MG PO TABS: 1.0000 | ORAL_TABLET | Freq: Two times a day (BID) | ORAL | 0 refills | Status: AC

## 2024-07-29 MED ORDER — BUPIVACAINE LIPOSOME 1.3 % IJ SUSP
INTRAMUSCULAR | Status: AC
Start: 2024-07-29 — End: 2024-07-29
  Filled 2024-07-29: qty 20

## 2024-07-29 MED ORDER — HYDROMORPHONE HCL 1 MG/ML IJ SOLN
0.2500 mg | INTRAMUSCULAR | Status: DC | PRN
  Administered 2024-07-29 (×2): 0.5 mg via INTRAVENOUS

## 2024-07-29 MED ORDER — LIDOCAINE HCL (CARDIAC) PF 100 MG/5ML IV SOSY
PREFILLED_SYRINGE | INTRAVENOUS | Status: DC | PRN
  Administered 2024-07-29: 60 mg via INTRAVENOUS

## 2024-07-29 MED ORDER — ACETAMINOPHEN 500 MG PO TABS
1000.0000 mg | ORAL_TABLET | Freq: Four times a day (QID) | ORAL | Status: DC
  Administered 2024-07-29 – 2024-07-30 (×3): 1000 mg via ORAL
  Filled 2024-07-29 (×4): qty 2

## 2024-07-29 MED ORDER — HYDROCODONE-ACETAMINOPHEN 5-325 MG PO TABS: 1.0000 | ORAL_TABLET | Freq: Four times a day (QID) | ORAL | 0 refills | Status: AC | PRN

## 2024-07-29 MED ORDER — AMISULPRIDE (ANTIEMETIC) 5 MG/2ML IV SOLN: 10.0000 mg | Freq: Once | INTRAVENOUS | Status: DC | PRN

## 2024-07-29 MED ORDER — DOCUSATE SODIUM 100 MG PO CAPS
100.0000 mg | ORAL_CAPSULE | Freq: Two times a day (BID) | ORAL | Status: DC
  Administered 2024-07-29 – 2024-07-30 (×2): 100 mg via ORAL
  Filled 2024-07-29 (×2): qty 1

## 2024-07-29 MED ORDER — OXYCODONE HCL 5 MG PO TABS
5.0000 mg | ORAL_TABLET | ORAL | Status: DC | PRN
  Administered 2024-07-29: 5 mg via ORAL
  Filled 2024-07-29: qty 1

## 2024-07-29 MED ORDER — IOHEXOL 300 MG/ML  SOLN
INTRAMUSCULAR | Status: DC | PRN
  Administered 2024-07-29: 18 mL

## 2024-07-29 MED ORDER — ISOSORBIDE MONONITRATE ER 30 MG PO TB24
30.0000 mg | ORAL_TABLET | Freq: Every day | ORAL | Status: DC
  Administered 2024-07-29 – 2024-07-30 (×2): 30 mg via ORAL
  Filled 2024-07-29 (×2): qty 1

## 2024-07-29 MED ORDER — OXYCODONE HCL 5 MG/5ML PO SOLN: 5.0000 mg | Freq: Once | ORAL | Status: DC | PRN

## 2024-07-29 MED ORDER — DIPHENHYDRAMINE HCL 12.5 MG/5ML PO ELIX: 12.5000 mg | ORAL_SOLUTION | Freq: Four times a day (QID) | ORAL | Status: DC | PRN

## 2024-07-29 MED ORDER — PROPOFOL 10 MG/ML IV BOLUS
INTRAVENOUS | Status: AC
  Filled 2024-07-29: qty 20

## 2024-07-29 MED ORDER — ONDANSETRON HCL 4 MG/2ML IJ SOLN
INTRAMUSCULAR | Status: AC
  Filled 2024-07-29: qty 2

## 2024-07-29 MED ORDER — STERILE WATER FOR INJECTION IJ SOLN
INTRAMUSCULAR | Status: DC | PRN
  Administered 2024-07-29: 4 mL via INTRAMUSCULAR

## 2024-07-29 MED ORDER — ONDANSETRON HCL 4 MG/2ML IJ SOLN: 4.0000 mg | Freq: Once | INTRAMUSCULAR | Status: DC | PRN

## 2024-07-29 MED ORDER — HYDROMORPHONE HCL 1 MG/ML IJ SOLN: 0.5000 mg | INTRAMUSCULAR | Status: DC | PRN

## 2024-07-29 MED ORDER — STERILE WATER FOR IRRIGATION IR SOLN
Status: DC | PRN
  Administered 2024-07-29: 3000 mL via INTRAVESICAL
  Administered 2024-07-29: 1000 mL

## 2024-07-29 MED ORDER — DOCUSATE SODIUM 100 MG PO CAPS: 100.0000 mg | ORAL_CAPSULE | Freq: Two times a day (BID) | ORAL | Status: AC

## 2024-07-29 MED ORDER — CEFAZOLIN SODIUM-DEXTROSE 2-4 GM/100ML-% IV SOLN
2.0000 g | INTRAVENOUS | Status: AC
  Administered 2024-07-29: 2 g via INTRAVENOUS
  Filled 2024-07-29: qty 100

## 2024-07-29 MED ORDER — PHENYLEPHRINE HCL (PRESSORS) 10 MG/ML IV SOLN
INTRAVENOUS | Status: DC | PRN
  Administered 2024-07-29: 120 ug via INTRAVENOUS
  Administered 2024-07-29 (×2): 160 ug via INTRAVENOUS

## 2024-07-29 SURGICAL SUPPLY — 64 items
APPLICATOR COTTON TIP 6 STRL (MISCELLANEOUS) ×1 IMPLANT
BAG COUNTER SPONGE SURGICOUNT (BAG) IMPLANT
BAG URO CATCHER STRL LF (MISCELLANEOUS) ×1 IMPLANT
CATH FOLEY 2WAY SLVR 18FR 30CC (CATHETERS) ×1 IMPLANT
CATH URETL OPEN END 6FR 70 (CATHETERS) IMPLANT
CHLORAPREP W/TINT 26 (MISCELLANEOUS) ×1 IMPLANT
CLIP LIGATING HEM O LOK PURPLE (MISCELLANEOUS) ×2 IMPLANT
CLIP LIGATING HEMO O LOK GREEN (MISCELLANEOUS) IMPLANT
CLOTH BEACON ORANGE TIMEOUT ST (SAFETY) ×1 IMPLANT
CONNECTOR CATH FOLEY FEMALE LL (CATHETERS) IMPLANT
COVER SURGICAL LIGHT HANDLE (MISCELLANEOUS) ×1 IMPLANT
COVER TIP SHEARS 8 DVNC (MISCELLANEOUS) ×1 IMPLANT
CUTTER ECHEON FLEX ENDO 45 340 (ENDOMECHANICALS) IMPLANT
DERMABOND ADVANCED .7 DNX12 (GAUZE/BANDAGES/DRESSINGS) ×1 IMPLANT
DRAPE ARM DVNC X/XI (DISPOSABLE) ×4 IMPLANT
DRAPE COLUMN DVNC XI (DISPOSABLE) ×1 IMPLANT
DRIVER NDL LRG 8 DVNC XI (INSTRUMENTS) ×2 IMPLANT
DRIVER NDLE LRG 8 DVNC XI (INSTRUMENTS) ×2 IMPLANT
DRSG TEGADERM 4X4.75 (GAUZE/BANDAGES/DRESSINGS) ×1 IMPLANT
ELECT REM PT RETURN 15FT ADLT (MISCELLANEOUS) ×1 IMPLANT
FORCEPS BPLR FENES DVNC XI (FORCEP) ×1 IMPLANT
FORCEPS BPLR LNG DVNC XI (INSTRUMENTS) ×1 IMPLANT
FORCEPS PROGRASP DVNC XI (FORCEP) ×1 IMPLANT
GAUZE 4X4 16PLY ~~LOC~~+RFID DBL (SPONGE) IMPLANT
GLOVE BIO SURGEON STRL SZ 6.5 (GLOVE) ×1 IMPLANT
GLOVE SURG LX STRL 7.5 STRW (GLOVE) ×2 IMPLANT
GOWN SRG XL LVL 4 BRTHBL STRL (GOWNS) ×1 IMPLANT
GOWN STRL REUS W/ TWL XL LVL3 (GOWN DISPOSABLE) ×1 IMPLANT
GUIDEWIRE STR DUAL SENSOR (WIRE) ×1 IMPLANT
HOLDER FOLEY CATH W/STRAP (MISCELLANEOUS) ×1 IMPLANT
IRRIGATION SUCT STRKRFLW 2 WTP (MISCELLANEOUS) ×1 IMPLANT
KIT PROCEDURE DVNC SI (MISCELLANEOUS) IMPLANT
KIT TURNOVER KIT A (KITS) ×1 IMPLANT
LOOP VESSEL MAXI BLUE (MISCELLANEOUS) IMPLANT
MANIFOLD NEPTUNE II (INSTRUMENTS) ×1 IMPLANT
NDL ASPIRATION 22 (NEEDLE) ×1 IMPLANT
NDL INSUFFLATION 14GA 120MM (NEEDLE) ×1 IMPLANT
NEEDLE ASPIRATION 22 (NEEDLE) ×1 IMPLANT
NEEDLE INSUFFLATION 14GA 120MM (NEEDLE) ×1 IMPLANT
NS IRRIG 1000ML POUR BTL (IV SOLUTION) IMPLANT
PACK CYSTO (CUSTOM PROCEDURE TRAY) ×1 IMPLANT
PACK ROBOT UROLOGY CUSTOM (CUSTOM PROCEDURE TRAY) ×1 IMPLANT
PAD POSITIONING PINK XL (MISCELLANEOUS) ×1 IMPLANT
PORT ACCESS TROCAR AIRSEAL 12 (TROCAR) ×1 IMPLANT
SCISSORS MNPLR CVD DVNC XI (INSTRUMENTS) ×1 IMPLANT
SEAL UNIV 5-12 XI (MISCELLANEOUS) ×4 IMPLANT
SET TRI-LUMEN FLTR TB AIRSEAL (TUBING) ×1 IMPLANT
SOLUTION ELECTROSURG ANTI STCK (MISCELLANEOUS) ×1 IMPLANT
SPIKE FLUID TRANSFER (MISCELLANEOUS) ×1 IMPLANT
SPONGE T-LAP 4X18 ~~LOC~~+RFID (SPONGE) IMPLANT
SUT ETHILON 3 0 PS 1 (SUTURE) ×1 IMPLANT
SUT MNCRL AB 4-0 PS2 18 (SUTURE) ×2 IMPLANT
SUT PDS AB 0 CT1 36 (SUTURE) ×3 IMPLANT
SUT VIC AB 2-0 SH 27X BRD (SUTURE) IMPLANT
SUT VICRYL 0 27 CT2 27 ABS (SUTURE) IMPLANT
SUT VICRYL 0 UR6 27IN ABS (SUTURE) ×1 IMPLANT
SUT VLOC 180 2-0 9IN GS21 (SUTURE) IMPLANT
SUTURE STRAT PDS 2-0 15 CT-2.5 (SUTURE) IMPLANT
SUTURE STRATFX SPIRAL 3-0 PDS+ (SUTURE) IMPLANT
SUTURE VLOC BRB 180 ABS3/0GR12 (SUTURE) IMPLANT
SYSTEM BAG RETRIEVAL 10MM (BASKET) IMPLANT
TROCAR Z-THREAD FIOS 5X100MM (TROCAR) IMPLANT
TUBING CONNECTING 10 (TUBING) ×1 IMPLANT
WATER STERILE IRR 1000ML POUR (IV SOLUTION) ×1 IMPLANT

## 2024-07-29 NOTE — Transfer of Care (Signed)
 Immediate Anesthesia Transfer of Care Note  Patient: Randy Shaffer  Procedure(s) Performed: CYSTECTOMY, PARTIAL, ROBOT-ASSISTED, LAPAROSCOPIC CYSTOSCOPY WITH INDOCYANINE GREEN IMAGING (ICG) CYSTOSCOPY, WITH RETROGRADE PYELOGRAM  Patient Location: PACU  Anesthesia Type:General  Level of Consciousness: awake, alert , and oriented  Airway & Oxygen Therapy: Patient Spontanous Breathing  Post-op Assessment: Report given to RN and Post -op Vital signs reviewed and stable  Post vital signs: Reviewed and stable  Last Vitals:  Vitals Value Taken Time  BP 145/87 07/29/24 11:00  Temp 36.6 C 07/29/24 10:54  Pulse 71 07/29/24 11:02  Resp 19 07/29/24 11:02  SpO2 96 % 07/29/24 11:02  Vitals shown include unfiled device data.  Last Pain:  Vitals:   07/29/24 1054  TempSrc:   PainSc: 0-No pain         Complications: No notable events documented.

## 2024-07-29 NOTE — H&P (Signed)
 Randy Shaffer is an 73 y.o. male.    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 Does get occasional rare cystitis likely made worse by incomplete emptying. Cysto 2024 wide open prostate fossa. NO recent + CX's for review. He also c/o some chronic pelvic pain which would not be likely attributed to diverticulum that is majority sterile. Os of diverticulum appears midline few cm superior to trigone ridge. Rt ureter comes close to but no mass effect.   2 - Obstructive Voiding - s/p HoLEP 07/2022 by Francisca. Open prostate fossa on f/u cysto and 40gm or so gland after HoLep (great result)   3 - Prostate Screening - PSA 4.4 2023 at age 71 and negative path at HoLEP ==> STOP PSA based screening.   PMH sig for DM2 (A1c 6s), OSA/CPAP, Rt open inguinal hernia repair, PAD/Carotid Stent/Plavix  (follows Randy Shaffer vasc surgery), Non-cardiac chest pain ( negative cath, follows Randy Shaffer [facility] cards), His PCP is Randy Null MD with [facility].   Today  Randy Shaffer is seen to proceed with partial cystectomy for large diverticulium with incomplete empyting. NO interval fevers. Most recent UA without infectious parameters. Hgb  15, Cr 2 most recently.    Past Medical History:  Diagnosis Date   Anginal pain    Aortic atherosclerosis    BPH (benign prostatic hyperplasia)    CAD (coronary artery disease) 12/13/2021   a.) R/LHC 12/13/2021: EF 55-65%, 25% dLM-oLAD, 25% pLAD, 50% m-dLAD; mPA 20, mPCWP 10, Ao sat 96, PA sat 71, CO 5.7.   Cardiac murmur    Carotid artery disease    a.) CTA neck 03/02/2022: 70% LICA; b.) PTA 04/19/2022: 80% LICA --> POBA performed  followed by placement of a 9 mm prox, 7mm distal, 4 cm long exact stent placed; 10-15% residual stenosis; c.) carotid doppler 05/22/2022: 1-39% BICA   Complication of anesthesia    a.) PONV   Depression    Diet-controlled type 2 diabetes mellitus (HCC)     Diverticulosis 09/05/2011   DOE (dyspnea on exertion)    History of bilateral cataract extraction    HLD (hyperlipidemia)    Hypertension    Insomnia    a.) takes trazodone  PRN   Long term current use of antithrombotics/antiplatelets    a.) on daily DAPT (ASA + clopidogrel )   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    Neuromuscular disorder (HCC)    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   OA (osteoarthritis) of knee    PONV (postoperative nausea and vomiting)    SEVERE   Sleep apnea    a.) unable to tolerate nocturnal PAP therapy    Past Surgical History:  Procedure Laterality Date   AMPUTATION FINGER / THUMB Right    THUMB PLUS 3 RECONSTRUCTIVE SURGERIES   CAROTID PTA/STENT INTERVENTION Left 04/16/2022   Procedure: CAROTID PTA/STENT INTERVENTION;  Surgeon: Marea Selinda RAMAN, MD;  Location: [facility];  Service: Cardiovascular;  Laterality: Left;   COLONOSCOPY WITH PROPOFOL  N/A 02/01/2023   Procedure: COLONOSCOPY WITH PROPOFOL ;  Surgeon: Maryruth Ole DASEN, MD;  Location: [facility];  Service: Endoscopy;  Laterality: N/A;   CORONARY ANGIOPLASTY     HEAD LACERATION REPAIR     HERNIA REPAIR  09/24/1998   ONE AS INFANT   HIP NERVE RELEASE     HOLEP-LASER ENUCLEATION OF THE PROSTATE WITH MORCELLATION N/A 08/03/2022   Procedure: HOLEP-LASER ENUCLEATION OF THE PROSTATE WITH MORCELLATION;  Surgeon: Francisca Redell BROCKS, MD;  Location: [facility];  Service: Urology;  Laterality: N/A;   KNEE ARTHROSCOPY Right 09/24/2001   KNEE ARTHROSCOPY Right 02/23/2015   Procedure: ARTHROSCOPY KNEE WITH DEBRIDEMENT PARTIAL MEDIAL MENISECTOMY;  Surgeon: Norleen JINNY Maltos, MD;  Location: [facility];  Service: Orthopedics;  Laterality: Right;   PHOTOREFRACTIVE KERATOTOMY     RIGHT/LEFT HEART CATH AND CORONARY ANGIOGRAPHY N/A 12/13/2021   Procedure: RIGHT/LEFT HEART CATH AND CORONARY ANGIOGRAPHY;  Surgeon: Florencio Cara BIRCH, MD;  Location: [facility];  Service: Cardiovascular;  Laterality: N/A;    Family History   Problem Relation Age of Onset   Heart disease Mother    Cancer Mother    Heart attack Father    Cancer Sister    Cancer Brother    Heart attack Maternal Uncle    Social History:  reports that he has never smoked. He has never been exposed to tobacco smoke. He has never used smokeless tobacco. He reports current alcohol use. He reports that he does not use drugs.  Allergies:  Allergies  Allergen Reactions   Thiopental Sodium [Thiopental] Nausea And Vomiting    SEVERE VOMITING    No medications prior to admission.    No results found for this or any previous visit (from the past 48 hours). No results found.  Review of Systems  Constitutional:  Negative for chills and fever.  All other systems reviewed and are negative.   There were no vitals taken for this visit. Physical Exam Vitals reviewed.  HENT:     Head: Normocephalic.  Eyes:     Pupils: Pupils are equal, round, and reactive to light.  Cardiovascular:     Rate and Rhythm: Normal rate.  Pulmonary:     Effort: Pulmonary effort is normal.  Abdominal:     General: Abdomen is flat.  Genitourinary:    Comments: No CVAT at present Musculoskeletal:        General: Normal range of motion.     Cervical back: Normal range of motion.  Skin:    General: Skin is warm.  Neurological:     General: No focal deficit present.     Mental Status: He is alert.  Psychiatric:        Mood and Affect: Mood normal.      Assessment/Plan  Proceed as planned with cysto/ICG robotic partial cystectomy. Risks, benefits, alternatives, expected peri-op course discussed previously and reiterated today.   Ricardo KATHEE Alvaro Mickey., MD 07/29/2024, 6:34 AM

## 2024-07-29 NOTE — Brief Op Note (Signed)
 07/29/2024  10:43 AM  PATIENT:  Randy Shaffer  73 y.o. male  PRE-OPERATIVE DIAGNOSIS:  BLADDER DIVERTICULUM  POST-OPERATIVE DIAGNOSIS:  BLADDER DIVERTICULUM  PROCEDURE:  Procedure(s): CYSTECTOMY, PARTIAL, ROBOT-ASSISTED, LAPAROSCOPIC (N/A) CYSTOSCOPY WITH INDOCYANINE GREEN IMAGING (ICG) (N/A) CYSTOSCOPY, WITH RETROGRADE PYELOGRAM (N/A)  SURGEON:  Surgeons and Role:    * Manny, Ricardo KATHEE Raddle., MD - Primary  PHYSICIAN ASSISTANT:   ASSISTANTS: Alan Hammonds PA  ANESTHESIA:   local and general  EBL:  minimal   BLOOD ADMINISTERED:none  DRAINS: 1 - JP to bulb; 2 Foley to gravity   LOCAL MEDICATIONS USED:  MARCAINE      SPECIMEN:  Source of Specimen:  bladder diverticulum  DISPOSITION OF SPECIMEN:  PATHOLOGY  COUNTS:  YES  TOURNIQUET:  * No tourniquets in log *  DICTATION: .Other Dictation: Dictation Number [id number]  PLAN OF CARE: Admit to inpatient   PATIENT DISPOSITION:  PACU - hemodynamically stable.   Delay start of Pharmacological VTE agent (>24hrs) due to surgical blood loss or risk of bleeding: yes

## 2024-07-29 NOTE — Plan of Care (Signed)

## 2024-07-29 NOTE — Anesthesia Procedure Notes (Signed)
 Procedure Name: Intubation Date/Time: 07/29/2024 9:06 AM  Performed by: Gladis Honey, CRNAPre-anesthesia Checklist: Patient identified, Emergency Drugs available, Suction available and Patient being monitored Patient Re-evaluated:Patient Re-evaluated prior to induction Oxygen Delivery Method: Circle System Utilized Preoxygenation: Pre-oxygenation with 100% oxygen Induction Type: IV induction Ventilation: Mask ventilation without difficulty Laryngoscope Size: Glidescope and 4 Grade View: Grade I Tube type: Oral Tube size: 7.5 mm Number of attempts: 1 Airway Equipment and Method: Stylet and Oral airway Placement Confirmation: ETT inserted through vocal cords under direct vision, positive ETCO2 and breath sounds checked- equal and bilateral Secured at: 24 cm Tube secured with: Tape Dental Injury: Teeth and Oropharynx as per pre-operative assessment

## 2024-07-29 NOTE — Discharge Instructions (Addendum)
1- Drain Sites - You may have some mild persistent drainage from old drain site for several days, this is normal. This can be covered with cotton gauze for convenience.  2 - Stiches - Your stitches are all dissolvable. You may notice a "loose thread" at your incisions, these are normal and require no intervention. You may cut them flush to the skin with fingernail clippers if needed for comfort.  3 - Diet - No restrictions  4 - Activity - No heavy lifting / straining (any activities that require valsalva or "bearing down") x 4 weeks. Otherwise, no restrictions.  5 - Bathing - You may shower immediately. Do not take a bath or get into swimming pool where incision sites are submersed in water x 4 weeks.   6 - Catheter - Will remain in place until removed at your next appointment. It may be cleaned with soap and water in the shower. It may be disconnected from the drain bag while in the shower to avoid tripping over the tube. You may apply Neosporin or Vaseline ointment as needed to the tip of the penis where the catheter inserts to reduce friction and irritation in this spot.   7 - When to Call the Doctor - Call MD for any fever >102, any acute wound problems, or any severe nausea / vomiting. You can call the [facility] [phone]) 24 hours a day 365 days a year. It will roll-over to the answering service and on-call physician after hours.    You may resume Plavix 3 days after surgery.  You may resume aspirin, advil, aleve, vitamins, and supplements 7 days after surgery.

## 2024-07-29 NOTE — Op Note (Signed)
 NAME: Randy Shaffer, Randy Shaffer MEDICAL RECORD NO: [id number] ACCOUNT NO: [id number] DATE OF BIRTH: July 03, 1951 FACILITY: [facility] LOCATION: [id number] PHYSICIAN: Ricardo Likens, MD  Operative Report   DATE OF PROCEDURE: 07/29/2024  PREOPERATIVE DIAGNOSIS: Large bladder diverticulum.  PROCEDURE PERFORMED: 1. Cystoscopy  with bilateral retrograde pyelograms. 2. Robotic partial cystectomy.  ESTIMATED BLOOD LOSS:  50 mL.  COMPLICATIONS:  None.  SPECIMENS: Bladder diverticulum to pathology.  FINDINGS: 1.  Large capacity posterior diverticulum with mouth approximately 2 cm above the trigonal ridge. 2.  Diverticulum close to but not involving the right distal ureter.  ASSISTANT:  Alan Hammonds, PA.  DRAINS: 1.  Jackson-Pratt drain to bulb suction. 2.  Foley catheter to straight drain.  INDICATIONS: The patient is a 73 year old man with a history of obstructive voiding and also incomplete emptying. He is status post laser enucleation of the prostate by one of my colleagues, which resulted in excellent resolution of his obstructive  symptoms. However, he has continued to retain urine and was found to have a large posterior diverticulum. He does have some chronic pelvic fullness and pain as well as occasional urinary tract infections likely due to his incomplete emptying of the  diverticulum. Discussed options of management including continued care versus definitive management with partial cystectomy to remove this and he wished to proceed.  Imaging revealed that this was likely close to his distal ureters.  Informed consent was  obtained and placed in the medical record.  DESCRIPTION OF PROCEDURE:  The patient's identity is being verified and the procedure being cysto, bilateral retrogrades and partial cystectomy was confirmed.  Procedure timeout was performed.  Intravenous antibiotics administered.  General endotracheal  anesthesia was induced.  The patient was placed into a low lithotomy position. A  sterile field was created, prepping and draping the patient's penis, perineum and proximal thighs using iodine . and his infraxiphoid abdomen using chlorhexidine  gluconate.   After he was clipper shaven and after his arms were tucked to his side with padded gel rolls, he was further fastened to operative table using a 3-inch tape over foam padding across the supraxiphoid chest.  A test of steep Trendelenburg position was  performed and found to be suitably positioned.  An LAVH type drape was placed.  Next, cystourethroscopy performed using a 21-French rigid cystoscope with offset lens.  Inspection of anterior and posterior urethra was unremarkable.  There was a wide open and well-mucosalized prostatic defect consistent with prior manipulation.  Inspection of the urinary bladder revealed bilateral single ureteral orifices and a diverticulum mouth approximately 2 cm in diameter, approximately 2 cm superior to the trigone ridge.   The left ureteral orifice was cannulated with a 6-French end-hole catheter, and left retrograde pyelogram was obtained.    Left retrograde pyelogram demonstrated single left ureter, single system left kidney.  No filling defects or narrowing noted  Next, a right retrograde pyelogram was obtained.  Right retrograde pyelogram demonstrated a single right ureter and a single system right kidney. No filling defects or narrowing was noted. The distal ureter did seem to have some splaying laterally consistent with some mass effect from the diverticulum;  however, with visualization of the ureteral orifice well away from the mouth, I did not feel that stenting would be warranted. Next, 2 mL of ICG dye was injected around the mouth of the diverticulum in a tissue tattooing type fashion to better identify  the diverticulum mouth later in the surgery.  A Foley catheter was placed to straight drainage with 15 mL  in the balloon.  Next, a high flow, low pressure pneumoperitoneum was  obtained using Veress technique having passed the aspiration drop test. An 8-mm robotic camera port was then placed in this location. Laparoscopic examination of the peritoneal cavity revealed no  significant adhesions and no visceral injury. Additional ports were placed as follows: a right paramedian 8 mm robotic port, a right far lateral  12 mm AirSeal assistant port, a right paramedian 5 mm suction port, a left paramedian 8-mm robotic port, and  a left far lateral 8-mm robotic port.  The robot was docked and passed its electronic checks.   Attention was then directed to identification of the distal ureters.  An incision was made in the posterior peritoneum lateral to the sigmoid along the course of the iliac vessels. The right ureter was encountered as it coursed over this, marked with a  vessel loop, and then dissected distally to the ureterovesical junction, so the right ureter was mobilized and well visualized and marked with a vessel loop. Contralateral dissection was performed on the left side. I identified the left ureter, marked it  with a vessel loop, and dissected it distally to the left ureterovesical junction for better identification.  Next, the bladder was filled with approximately 200 mL of saline.  Using a combination of white light and fluorescence light visualization, the presumed area of diverticulum was noted in the posterior location and the green dyeing at the mouth was able  to be visualized. Circumferential dissection was performed first anteriorly, then posteriorly, then right side, then left side, conning down in a stepwise fashion towards the mouth of the diverticulum. The diverticulum was thin-walled and relatively  large as anticipated and the prior ureteral dissection was quite helpful in allowing visualization of the structures to keep them away from the diverticulum dissection and avoiding injury.  The anterior plane was used to incise the bladder approximately  2 cm  superior to the superior mouth of the diverticulum. The bladder was entered and the mouth was excised circumferentially keeping at least a 5-mm circumferential border. This completely separated the diverticulum from the true bladder. I was quite  happy with the completeness and the safety of the dissection. The ureters remained visibly viable. Ureteral jets were visualized via the small cystotomy. This was then closed in the first layer using running 3-0 V-Loc, second layer of imbricating 2-0  StrataFix, incorporating the posterior peritoneum, retroperitonealizing the structures once again. The bladder was irrigated quantitatively.  Sponge and needle counts were correct. Hemostasis was excellent. Vessel loops were removed. The specimen was  placed in an EndoCatch bag for retrieval. A closed suction drain was placed about the previous lateral most port site inferior to the peritoneal cavity.  The robot was then undocked.  After the previous right lateral assistant port site was closed at the  level of the fascia using a Carter-Thomason suture passer with 0 Vicryl.  The specimen was retrieved by dissecting the previous camera port sites inferiorly to a total of approximately 3 cm, removing the partial cystectomy specimen, which was set aside  for permanent pathology. The extraction site was closed in layers using figure-of-eight PDS x 4 followed by reapproximation of Scarpa's with running Vicryl.  All incision sites were infiltrated with dilute lipolyzed Marcaine  and closed at the level of  the skin using subcuticular Monocryl and Dermabond.  The procedure was then terminated.  The patient tolerated the procedure well.  No immediate periprocedural complications.  The patient was taken to  post anesthesia care unit in stable condition.  Please note, first assistant Alan Hammonds was crucial to all portions of surgery today.  She provided invaluable retraction, suctioning, specimen manipulation, suture  manipulation and general first assistance.    MUK D: 07/29/2024 10:52:54 am T: 07/29/2024 9:18:00 pm  JOB: [id number]/ [id number]

## 2024-07-29 NOTE — Anesthesia Postprocedure Evaluation (Signed)
 Anesthesia Post Note  Patient: Randy Shaffer  Procedure(s) Performed: CYSTECTOMY, PARTIAL, ROBOT-ASSISTED, LAPAROSCOPIC CYSTOSCOPY WITH INDOCYANINE GREEN IMAGING (ICG) CYSTOSCOPY, WITH RETROGRADE PYELOGRAM     Patient location during evaluation: PACU Anesthesia Type: General Level of consciousness: awake and alert, oriented and patient cooperative Pain management: pain level controlled Vital Signs Assessment: post-procedure vital signs reviewed and stable Respiratory status: spontaneous breathing, nonlabored ventilation and respiratory function stable Cardiovascular status: blood pressure returned to baseline and stable Postop Assessment: no apparent nausea or vomiting Anesthetic complications: no   No notable events documented.  Last Vitals:  Vitals:   07/29/24 1315 07/29/24 1412  BP:  (!) 149/90  Pulse: 66 64  Resp: 12 20  Temp:  36.9 C  SpO2: 93% 96%    Last Pain:  Vitals:   07/29/24 1412  TempSrc: Oral  PainSc:                  Randy Shaffer

## 2024-07-30 ENCOUNTER — Encounter (HOSPITAL_COMMUNITY): Payer: Self-pay | Admitting: Urology

## 2024-07-30 LAB — HEMOGLOBIN AND HEMATOCRIT, BLOOD
HCT: 45.5 % (ref 39.0–52.0)
Hemoglobin: 15 g/dL (ref 13.0–17.0)

## 2024-07-30 LAB — BASIC METABOLIC PANEL WITH GFR
Anion gap: 8 (ref 5–15)
BUN: 15 mg/dL (ref 8–23)
CO2: 27 mmol/L (ref 22–32)
Calcium: 8.8 mg/dL — ABNORMAL LOW (ref 8.9–10.3)
Chloride: 104 mmol/L (ref 98–111)
Creatinine, Ser: 1.03 mg/dL (ref 0.61–1.24)
GFR, Estimated: >60 mL/min (ref >60)
Glucose, Bld: 169 mg/dL — ABNORMAL HIGH (ref 70–99)
Potassium: 4.5 mmol/L (ref 3.5–5.1)
Sodium: 138 mmol/L (ref 135–145)

## 2024-07-30 LAB — GLUCOSE, CAPILLARY
Glucose-Capillary: 179 mg/dL — ABNORMAL HIGH (ref 70–99)
Glucose-Capillary: 148 mg/dL — ABNORMAL HIGH (ref 70–99)

## 2024-07-30 NOTE — Progress Notes (Signed)
   07/30/24 0834  TOC Brief Assessment  Insurance and Status Reviewed  Patient has primary care physician Yes  Home environment has been reviewed Resides in single family home with spouse  Prior level of function: Independent with ADLs at baseline  Prior/Current Home Services No current home services  Social Drivers of Health Review SDOH reviewed no interventions necessary  Readmission risk has been reviewed Yes  Transition of care needs no transition of care needs at this time

## 2024-07-30 NOTE — Discharge Summary (Signed)
 Physician Discharge Summary  Patient ID: Randy Shaffer MRN: [id number] DOB/AGE: [birth year] 73 y.o.  Admit date: 07/29/2024 Discharge date: 07/30/2024  Admission Diagnoses: Large Bladder Diverticulum  Discharge Diagnoses:  Principal Problem:   Bladder diverticulum   Discharged Condition: good  Hospital Course: Pt underwent robotic resection of large bladder diverticulum on 07/29/24, they day of admission, without acute complication. Admitted to 4th floor Urology service post-op. By the afternoon of POD 1, the day of discharge, he is ambulatory, pain controlled with PO meds, maintaining PO nutrition, and felt to be adequate for discharge. Hgb 15, Cr 1.03, final path pending at discharge. JP removed as output scant, foley to stay.   Consults: None  Significant Diagnostic Studies: labs: as per above  Treatments: surgery: as per above  Discharge Exam: Blood pressure 126/64, pulse (!) 58, temperature 98.5 F (36.9 C), resp. rate 18, height 6' 4 (1.93 m), weight 110.2 kg, SpO2 96%.  NAD, AOx3, wife at bedside NLB-RA SNTND, recent surgical sites c/d/I. JP with scant non-foul serosanguinous output, removed and dry dressing placed. Inspected and intact.  Circ'd, foley with tea-colored non-foul urine.  Disposition: HOME   Allergies as of 07/30/2024       Reactions   Thiopental Sodium [thiopental] Nausea And Vomiting   SEVERE VOMITING        Medication List     STOP taking these medications    aspirin  EC 81 MG tablet   clopidogrel  75 MG tablet Commonly known as: PLAVIX        TAKE these medications    atorvastatin  40 MG tablet Commonly known as: LIPITOR Take 20 mg by mouth in the morning.   docusate sodium 100 MG capsule Commonly known as: COLACE Take 1 capsule (100 mg total) by mouth 2 (two) times daily.   glipiZIDE 5 MG 24 hr tablet Commonly known as: GLUCOTROL XL Take 5 mg by mouth daily.   HYDROcodone -acetaminophen  5-325 MG tablet Commonly known as:  NORCO/VICODIN Take 1-2 tablets by mouth every 6 (six) hours as needed for moderate pain (pain score 4-6) or severe pain (pain score 7-10).   isosorbide  mononitrate 30 MG 24 hr tablet Commonly known as: IMDUR  Take 30 mg by mouth daily.   lamoTRIgine 100 MG tablet Commonly known as: LAMICTAL Take 100 mg by mouth daily. What changed:  how much to take when to take this   metoprolol  succinate 25 MG 24 hr tablet Commonly known as: TOPROL -XL Take 12.5 mg by mouth in the morning and at bedtime.   Olopatadine HCl 0.6 % Soln Place 2 sprays into the nose in the morning and at bedtime.   sulfamethoxazole -trimethoprim  800-160 MG tablet Commonly known as: BACTRIM  DS Take 1 tablet by mouth 2 (two) times daily. Start the day prior to foley removal appointment   traZODone  50 MG tablet Commonly known as: DESYREL  Take 50 mg by mouth at bedtime as needed for sleep.        Follow-up Information     Alvaro Ricardo KATHEE Raddle., MD Follow up on 08/10/2024.   Specialty: Urology Why: at 8:30 for X-Ray, pathology review, and catheter removal Contact information: [address] [place] [address] [phone]                 Signed: Ricardo KATHEE Alvaro Raddle. 07/30/2024, 12:35 PM

## 2024-07-31 LAB — SURGICAL PATHOLOGY

## 2024-10-06 ENCOUNTER — Other Ambulatory Visit: Payer: Self-pay | Admitting: Pulmonary Disease

## 2024-11-05 ENCOUNTER — Ambulatory Visit: Admitting: Cardiology

## 2024-12-01 ENCOUNTER — Encounter (INDEPENDENT_AMBULATORY_CARE_PROVIDER_SITE_OTHER)

## 2024-12-01 ENCOUNTER — Ambulatory Visit (INDEPENDENT_AMBULATORY_CARE_PROVIDER_SITE_OTHER): Admitting: Vascular Surgery

## 2024-12-22 ENCOUNTER — Ambulatory Visit: Admitting: Urology
